# Patient Record
Sex: Female | Born: 1940 | Race: White | Hispanic: No | Smoking: Never smoker
Health system: Southern US, Community
[De-identification: ages and names within clinical notes are randomized; demographics above are authoritative.]

## PROBLEM LIST (undated history)

## (undated) DIAGNOSIS — E785 Hyperlipidemia, unspecified: Secondary | ICD-10-CM

## (undated) DIAGNOSIS — K219 Gastro-esophageal reflux disease without esophagitis: Secondary | ICD-10-CM

## (undated) DIAGNOSIS — I639 Cerebral infarction, unspecified: Secondary | ICD-10-CM

## (undated) DIAGNOSIS — G459 Transient cerebral ischemic attack, unspecified: Secondary | ICD-10-CM

## (undated) DIAGNOSIS — R609 Edema, unspecified: Secondary | ICD-10-CM

## (undated) DIAGNOSIS — W3400XA Accidental discharge from unspecified firearms or gun, initial encounter: Secondary | ICD-10-CM

## (undated) DIAGNOSIS — R441 Visual hallucinations: Secondary | ICD-10-CM

## (undated) DIAGNOSIS — S0193XA Puncture wound without foreign body of unspecified part of head, initial encounter: Secondary | ICD-10-CM

## (undated) HISTORY — PX: OTHER SURGICAL HISTORY: SHX169

## (undated) HISTORY — PX: HAND SURGERY: SHX662

## (undated) HISTORY — PX: APPENDECTOMY: SHX54

## (undated) HISTORY — PX: FOOT SURGERY: SHX648

---

## 1999-02-10 ENCOUNTER — Encounter: Admission: RE | Admit: 1999-02-10 | Discharge: 1999-05-11 | Payer: Self-pay | Admitting: Family Medicine

## 2001-02-24 ENCOUNTER — Encounter: Payer: Self-pay | Admitting: Emergency Medicine

## 2001-02-24 ENCOUNTER — Emergency Department (HOSPITAL_COMMUNITY): Admission: EM | Admit: 2001-02-24 | Discharge: 2001-02-24 | Payer: Self-pay | Admitting: Emergency Medicine

## 2001-09-08 ENCOUNTER — Other Ambulatory Visit: Admission: RE | Admit: 2001-09-08 | Discharge: 2001-09-08 | Payer: Self-pay | Admitting: Family Medicine

## 2001-09-28 ENCOUNTER — Encounter: Payer: Self-pay | Admitting: Family Medicine

## 2001-09-28 ENCOUNTER — Ambulatory Visit (HOSPITAL_COMMUNITY): Admission: RE | Admit: 2001-09-28 | Discharge: 2001-09-28 | Payer: Self-pay | Admitting: Family Medicine

## 2002-02-03 ENCOUNTER — Ambulatory Visit (HOSPITAL_COMMUNITY): Admission: RE | Admit: 2002-02-03 | Discharge: 2002-02-03 | Payer: Self-pay | Admitting: Family Medicine

## 2002-02-03 ENCOUNTER — Encounter: Payer: Self-pay | Admitting: Family Medicine

## 2002-07-06 ENCOUNTER — Emergency Department (HOSPITAL_COMMUNITY): Admission: EM | Admit: 2002-07-06 | Discharge: 2002-07-06 | Payer: Self-pay | Admitting: Emergency Medicine

## 2002-12-20 ENCOUNTER — Encounter: Payer: Self-pay | Admitting: Family Medicine

## 2002-12-20 ENCOUNTER — Ambulatory Visit (HOSPITAL_COMMUNITY): Admission: RE | Admit: 2002-12-20 | Discharge: 2002-12-20 | Payer: Self-pay | Admitting: Family Medicine

## 2003-03-02 ENCOUNTER — Encounter: Payer: Self-pay | Admitting: Family Medicine

## 2003-03-02 ENCOUNTER — Ambulatory Visit (HOSPITAL_COMMUNITY): Admission: RE | Admit: 2003-03-02 | Discharge: 2003-03-02 | Payer: Self-pay | Admitting: Family Medicine

## 2003-03-04 ENCOUNTER — Encounter: Payer: Self-pay | Admitting: Family Medicine

## 2003-03-04 ENCOUNTER — Emergency Department (HOSPITAL_COMMUNITY): Admission: EM | Admit: 2003-03-04 | Discharge: 2003-03-04 | Payer: Self-pay | Admitting: Emergency Medicine

## 2003-03-04 ENCOUNTER — Encounter: Payer: Self-pay | Admitting: Emergency Medicine

## 2003-09-10 ENCOUNTER — Emergency Department (HOSPITAL_COMMUNITY): Admission: EM | Admit: 2003-09-10 | Discharge: 2003-09-10 | Payer: Self-pay | Admitting: Emergency Medicine

## 2004-01-15 ENCOUNTER — Ambulatory Visit (HOSPITAL_COMMUNITY): Admission: RE | Admit: 2004-01-15 | Discharge: 2004-01-15 | Payer: Self-pay | Admitting: Family Medicine

## 2004-06-03 ENCOUNTER — Emergency Department (HOSPITAL_COMMUNITY): Admission: EM | Admit: 2004-06-03 | Discharge: 2004-06-03 | Payer: Self-pay | Admitting: Emergency Medicine

## 2004-09-03 ENCOUNTER — Emergency Department (HOSPITAL_COMMUNITY): Admission: EM | Admit: 2004-09-03 | Discharge: 2004-09-03 | Payer: Self-pay | Admitting: Emergency Medicine

## 2004-10-06 ENCOUNTER — Ambulatory Visit (HOSPITAL_COMMUNITY): Admission: RE | Admit: 2004-10-06 | Discharge: 2004-10-06 | Payer: Self-pay | Admitting: Family Medicine

## 2004-10-21 ENCOUNTER — Ambulatory Visit (HOSPITAL_COMMUNITY): Admission: RE | Admit: 2004-10-21 | Discharge: 2004-10-21 | Payer: Self-pay | Admitting: Family Medicine

## 2004-10-29 ENCOUNTER — Ambulatory Visit: Payer: Self-pay | Admitting: Orthopedic Surgery

## 2004-11-06 ENCOUNTER — Encounter (HOSPITAL_COMMUNITY): Admission: RE | Admit: 2004-11-06 | Discharge: 2004-12-06 | Payer: Self-pay | Admitting: Orthopedic Surgery

## 2004-11-24 ENCOUNTER — Ambulatory Visit: Payer: Self-pay | Admitting: Orthopedic Surgery

## 2004-12-08 ENCOUNTER — Encounter (HOSPITAL_COMMUNITY): Admission: RE | Admit: 2004-12-08 | Discharge: 2005-01-07 | Payer: Self-pay | Admitting: Orthopedic Surgery

## 2005-02-04 ENCOUNTER — Observation Stay (HOSPITAL_COMMUNITY): Admission: EM | Admit: 2005-02-04 | Discharge: 2005-02-10 | Payer: Self-pay | Admitting: Emergency Medicine

## 2005-02-04 ENCOUNTER — Ambulatory Visit: Payer: Self-pay | Admitting: *Deleted

## 2005-08-20 ENCOUNTER — Emergency Department (HOSPITAL_COMMUNITY): Admission: EM | Admit: 2005-08-20 | Discharge: 2005-08-20 | Payer: Self-pay | Admitting: Emergency Medicine

## 2005-09-11 ENCOUNTER — Ambulatory Visit (HOSPITAL_COMMUNITY): Admission: RE | Admit: 2005-09-11 | Discharge: 2005-09-11 | Payer: Self-pay | Admitting: Family Medicine

## 2005-09-25 ENCOUNTER — Ambulatory Visit (HOSPITAL_COMMUNITY): Admission: RE | Admit: 2005-09-25 | Discharge: 2005-09-25 | Payer: Self-pay | Admitting: Family Medicine

## 2006-03-25 ENCOUNTER — Ambulatory Visit (HOSPITAL_COMMUNITY): Admission: RE | Admit: 2006-03-25 | Discharge: 2006-03-25 | Payer: Self-pay | Admitting: Family Medicine

## 2006-06-07 IMAGING — CR DG CHEST 1V
1 series · 1 of 1 positions shown · non-contrast
Comparison: 08/20/2005.

CLINICAL DATA: Productive cough.
 CHEST, ONE VIEW ? 09/11/2005 ? (6663 HOURS):

[view not recorded]
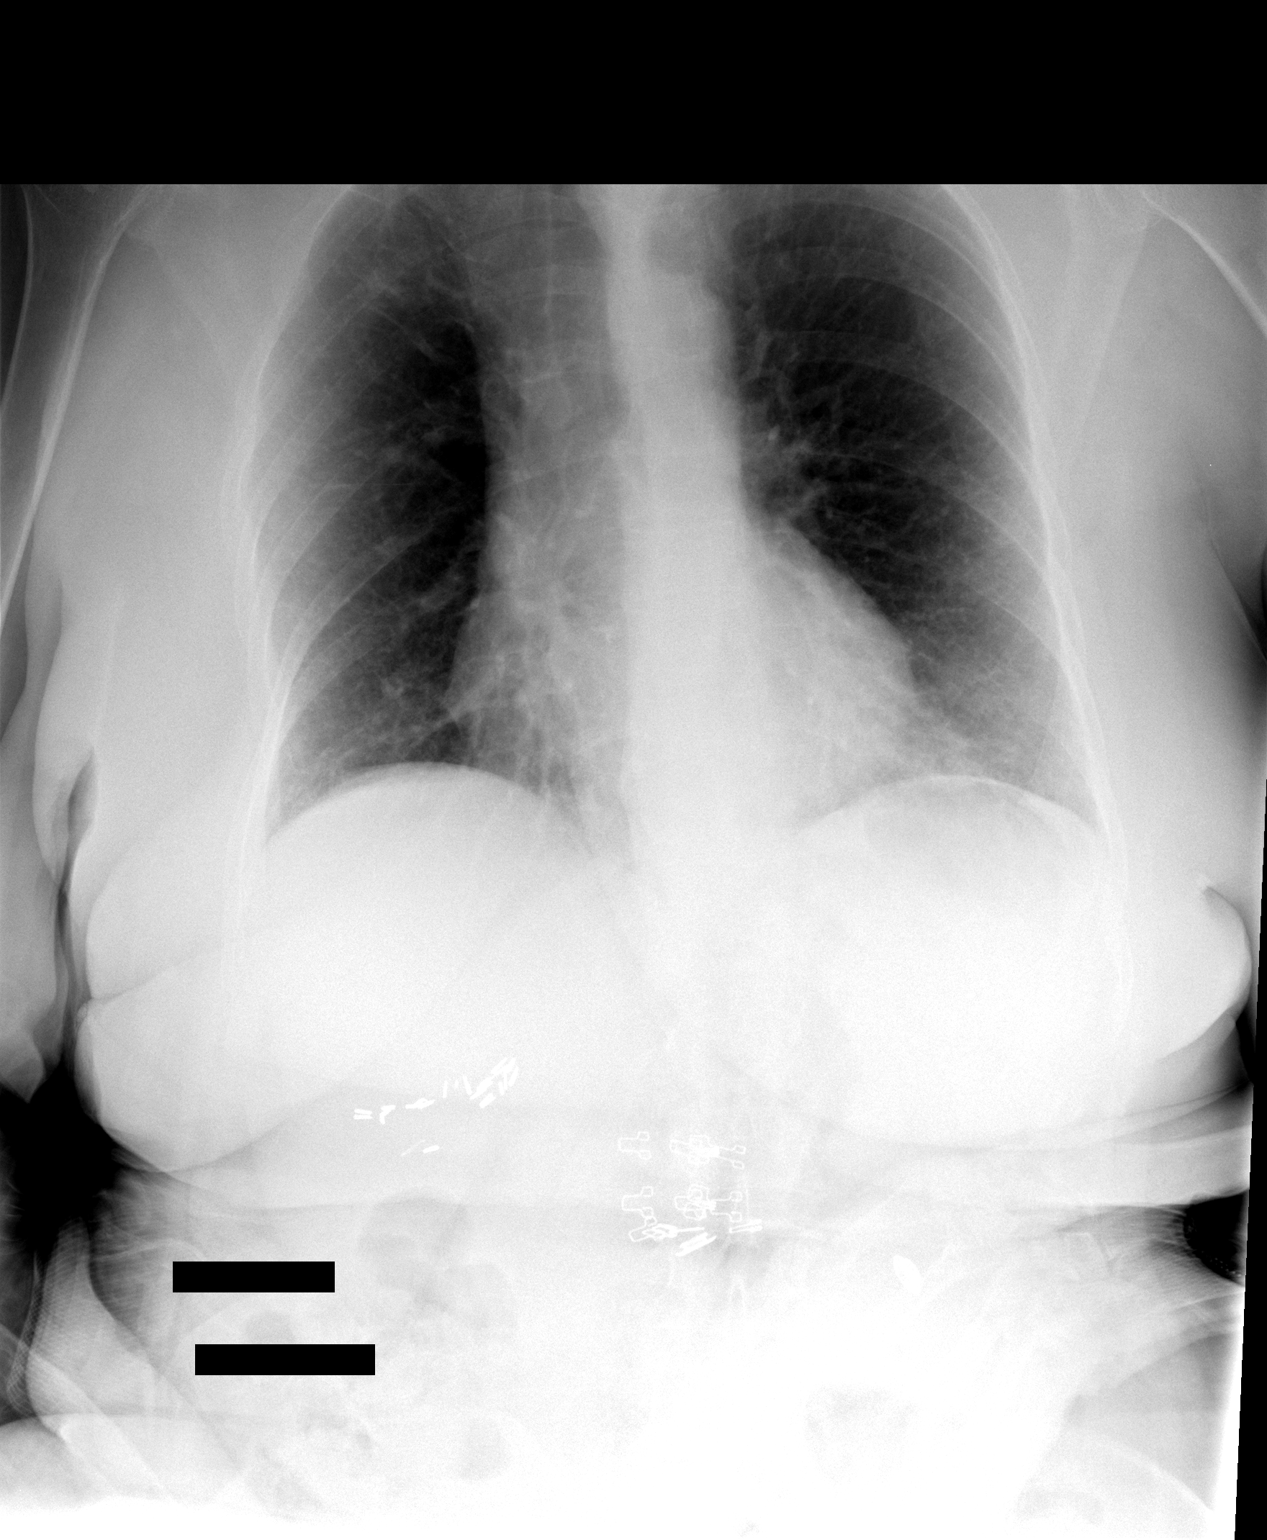

[1 of 1 positions shown; findings below may reference images not displayed]

FINDINGS: The heart is not enlarged.  There is no infiltrate or effusion.  There is COPD.
 There is an ill-defined density in the right upper lobe measuring about 1cm.  This could represent a mass lesion or possibly an early infiltrate.  Follow-up two view chest x-ray is suggested.
IMPRESSION: 1. COPD.
 2. Question right upper lobe density.  Follow-up chest x-ray suggested.

## 2009-03-29 ENCOUNTER — Emergency Department (HOSPITAL_COMMUNITY): Admission: EM | Admit: 2009-03-29 | Discharge: 2009-03-29 | Payer: Self-pay | Admitting: Emergency Medicine

## 2009-09-07 DIAGNOSIS — I639 Cerebral infarction, unspecified: Secondary | ICD-10-CM

## 2009-09-07 HISTORY — DX: Cerebral infarction, unspecified: I63.9

## 2010-12-14 LAB — URINALYSIS, ROUTINE W REFLEX MICROSCOPIC
Bilirubin Urine: NEGATIVE
Glucose, UA: NEGATIVE mg/dL
Ketones, ur: NEGATIVE mg/dL
Leukocytes, UA: NEGATIVE
Protein, ur: NEGATIVE mg/dL

## 2010-12-14 LAB — DIFFERENTIAL
Basophils Absolute: 0 10*3/uL (ref 0.0–0.1)
Eosinophils Absolute: 0.3 10*3/uL (ref 0.0–0.7)
Eosinophils Relative: 4 % (ref 0–5)
Lymphocytes Relative: 28 % (ref 12–46)
Neutrophils Relative %: 53 % (ref 43–77)

## 2010-12-14 LAB — CBC
Hemoglobin: 13.6 g/dL (ref 12.0–15.0)
MCV: 93.5 fL (ref 78.0–100.0)
RBC: 4.09 MIL/uL (ref 3.87–5.11)
WBC: 7.8 10*3/uL (ref 4.0–10.5)

## 2010-12-14 LAB — COMPREHENSIVE METABOLIC PANEL
ALT: 28 U/L (ref 0–35)
AST: 41 U/L — ABNORMAL HIGH (ref 0–37)
CO2: 31 mEq/L (ref 19–32)
Chloride: 96 mEq/L (ref 96–112)
Creatinine, Ser: 1.32 mg/dL — ABNORMAL HIGH (ref 0.4–1.2)
GFR calc Af Amer: 48 mL/min — ABNORMAL LOW (ref >60)
GFR calc non Af Amer: 40 mL/min — ABNORMAL LOW (ref >60)
Glucose, Bld: 144 mg/dL — ABNORMAL HIGH (ref 70–99)
Total Bilirubin: 0.6 mg/dL (ref 0.3–1.2)

## 2010-12-14 LAB — PROTIME-INR: Prothrombin Time: 12.7 seconds (ref 11.6–15.2)
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
## 2011-01-23 NOTE — Group Therapy Note (Signed)
NAME:  Catherine Barrett, Catherine Barrett                   ACCOUNT NO.:  [id number]   MEDICAL RECORD NO.:  [id number]          PATIENT TYPE:  INP   LOCATION:  A210                          FACILITY:  [facility]   PHYSICIAN:  Angus G. Renard Matter, MD   DATE OF BIRTH:  Apr 21, 1941   DATE OF PROCEDURE:  02/09/2005  DATE OF DISCHARGE:                                   PROGRESS NOTE   SUBJECTIVE:  This patient was admitted to the hospital with neurological  symptoms, weakness in right arm and leg, inability to walk, and slurring of  her speech. It was felt that this was unlikely an ischemic event but  worsening of neurological symptoms due to metabolic derangement.   OBJECTIVE:  VITAL SIGNS:  Blood pressure 95/50, respirations 20, pulse 71,  temperature 97.5.  LUNGS:  Clear to P&A.  HEART:  Regular rhythm.  ABDOMEN:  No palpable organs or masses.  EXTREMITIES:  Weakness of right arm and leg. Tenderness over the foot and  paraspinals muscles of low back.   ASSESSMENT:  The patient was admitted with weakness in the right arm and  leg, inability to walk, and slurring of speech.   PLAN:  Continue current regimen. Will attempt to ambulate patient more with  assistance of physical therapy.      AGM/MEDQ  D:  02/09/2005  T:  02/09/2005  Job:  [id number]
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
## 2011-01-23 NOTE — Group Therapy Note (Signed)
NAME:  Catherine Barrett, Catherine Barrett                   ACCOUNT NO.:  [id number]   MEDICAL RECORD NO.:  [id number]          PATIENT TYPE:  INP   LOCATION:  A210                          FACILITY:  [facility]   PHYSICIAN:  Angus G. Renard Matter, MD   DATE OF BIRTH:  [birth year]   DATE OF PROCEDURE:  02/08/2005  DATE OF DISCHARGE:                                   PROGRESS NOTE   SUBJECTIVE:  This patient was admitted to the hospital with neurological  symptoms, weakness in right arm and leg, inability to walk and slurring of  her speech.  It was felt that this was unlikely an ischemic event but  worsening of neurological symptoms due to metabolic derangement.  Patient  has had low back pain and pain in her right foot.   OBJECTIVE:  VITAL SIGNS:  Blood pressure 101/50, respirations 18, pulse 63,  temperature 97.  LUNGS:  Clear to P&A.  HEART:  Regular rhythm.  ABDOMEN:  No palpable organs or masses.  MUSCULOSKELETAL:  Patient has weakness of right side with contracture of  right arm.  Tenderness of the paraspinous muscles of the low back.   ASSESSMENT:  Same as above.   PLAN:  Plan to continue current regimen.  We will continue with gradual  ambulation with the help of physical therapy.      AGM/MEDQ  D:  02/08/2005  T:  02/08/2005  Job:  [id number]
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
## 2011-01-23 NOTE — Group Therapy Note (Signed)
NAME:  Catherine Barrett, Catherine Barrett                   ACCOUNT NO.:  [id number]   MEDICAL RECORD NO.:  [id number]           PATIENT TYPE:  INP   LOCATION:  A210                          FACILITY:  [facility]   PHYSICIAN:  Angus G. Renard Matter, MD   DATE OF BIRTH:  [birth year]   DATE OF PROCEDURE:  02/05/2005  DATE OF DISCHARGE:                                   PROGRESS NOTE   SUBJECTIVE:  This patient was admitted with weakness in the right leg,  inability to walk.   OBJECTIVE:  VITAL SIGNS:  Blood pressure 87/45, respirations 20, pulse 68,  temperature 97.2.  LUNGS:  Clear to P&A.  HEART:  Regular rhythm.  ABDOMEN:  No palpable organs or masses.  The patient has right sided  weakness.   ASSESSMENT:  The patient was admitted with inability to walk  Has possible  CVA, hypokalemia.   PLAN:  Proceed with carotid Doppler ultrasound, echocardiogram.  The patient  has low serum potassium.  Will give three runs of K-Ciel 10 mEq today,  repeat B-met.      AGM/MEDQ  D:  02/05/2005  T:  02/05/2005  Job:  [id number]

## 2011-03-17 ENCOUNTER — Other Ambulatory Visit: Payer: Self-pay

## 2011-03-17 ENCOUNTER — Emergency Department (HOSPITAL_COMMUNITY): Payer: Medicare Other

## 2011-03-17 ENCOUNTER — Observation Stay (HOSPITAL_COMMUNITY)
Admission: EM | Admit: 2011-03-17 | Discharge: 2011-03-18 | Disposition: A | Discharge status: Home / Self Care | Payer: Medicare Other | Attending: Internal Medicine | Admitting: Internal Medicine

## 2011-03-17 DIAGNOSIS — R112 Nausea with vomiting, unspecified: Secondary | ICD-10-CM | POA: Insufficient documentation

## 2011-03-17 DIAGNOSIS — E860 Dehydration: Secondary | ICD-10-CM

## 2011-03-17 DIAGNOSIS — E871 Hypo-osmolality and hyponatremia: Secondary | ICD-10-CM

## 2011-03-17 DIAGNOSIS — R42 Dizziness and giddiness: Secondary | ICD-10-CM | POA: Insufficient documentation

## 2011-03-17 DIAGNOSIS — W19XXXA Unspecified fall, initial encounter: Secondary | ICD-10-CM | POA: Diagnosis present

## 2011-03-17 DIAGNOSIS — M545 Low back pain, unspecified: Secondary | ICD-10-CM | POA: Insufficient documentation

## 2011-03-17 DIAGNOSIS — W050XXA Fall from non-moving wheelchair, initial encounter: Secondary | ICD-10-CM | POA: Insufficient documentation

## 2011-03-17 DIAGNOSIS — S301XXA Contusion of abdominal wall, initial encounter: Principal | ICD-10-CM | POA: Insufficient documentation

## 2011-03-17 DIAGNOSIS — N281 Cyst of kidney, acquired: Secondary | ICD-10-CM | POA: Insufficient documentation

## 2011-03-17 DIAGNOSIS — S0190XA Unspecified open wound of unspecified part of head, initial encounter: Secondary | ICD-10-CM | POA: Insufficient documentation

## 2011-03-17 DIAGNOSIS — M25559 Pain in unspecified hip: Secondary | ICD-10-CM | POA: Insufficient documentation

## 2011-03-17 DIAGNOSIS — M542 Cervicalgia: Secondary | ICD-10-CM | POA: Insufficient documentation

## 2011-03-17 DIAGNOSIS — R52 Pain, unspecified: Secondary | ICD-10-CM | POA: Insufficient documentation

## 2011-03-17 DIAGNOSIS — Z79899 Other long term (current) drug therapy: Secondary | ICD-10-CM | POA: Insufficient documentation

## 2011-03-17 HISTORY — DX: Visual hallucinations: R44.1

## 2011-03-17 HISTORY — DX: Puncture wound without foreign body of unspecified part of head, initial encounter: S01.93XA

## 2011-03-17 HISTORY — DX: Transient cerebral ischemic attack, unspecified: G45.9

## 2011-03-17 HISTORY — DX: Accidental discharge from unspecified firearms or gun, initial encounter: W34.00XA

## 2011-03-17 LAB — POCT I-STAT, CHEM 8
BUN: 20 mg/dL (ref 6–23)
Chloride: 90 mEq/L — ABNORMAL LOW (ref 96–112)
Creatinine, Ser: 1.2 mg/dL — ABNORMAL HIGH (ref 0.50–1.10)
Potassium: 3.2 mEq/L — ABNORMAL LOW (ref 3.5–5.1)
Sodium: 128 mEq/L — ABNORMAL LOW (ref 135–145)

## 2011-03-17 LAB — GLUCOSE, CAPILLARY: Glucose-Capillary: 106 mg/dL — ABNORMAL HIGH (ref 70–99)

## 2011-03-17 MED ORDER — SODIUM CHLORIDE 0.9 % IV SOLN
Freq: Once | INTRAVENOUS | Status: AC
  Administered 2011-03-17: 16:00:00 via INTRAVENOUS

## 2011-03-17 MED ORDER — PHENOBARBITAL 32.4 MG PO TABS
32.4000 mg | ORAL_TABLET | Freq: Once | ORAL | Status: AC
  Administered 2011-03-18: 30 mg via ORAL
  Filled 2011-03-17: qty 1

## 2011-03-17 MED ORDER — ALUM & MAG HYDROXIDE-SIMETH 200-200-20 MG/5ML PO SUSP: 30.0000 mL | Freq: Four times a day (QID) | ORAL | Status: DC | PRN

## 2011-03-17 MED ORDER — SENNA 8.6 MG PO TABS: 2.0000 | ORAL_TABLET | Freq: Every day | ORAL | Status: DC | PRN

## 2011-03-17 MED ORDER — ROSUVASTATIN CALCIUM 20 MG PO TABS: 20.0000 mg | ORAL_TABLET | Freq: Every day | ORAL | Status: DC

## 2011-03-17 MED ORDER — ONDANSETRON HCL 4 MG/2ML IJ SOLN: 4.0000 mg | Freq: Four times a day (QID) | INTRAMUSCULAR | Status: DC | PRN

## 2011-03-17 MED ORDER — CALCIUM CARBONATE-VITAMIN D 500-200 MG-UNIT PO TABS
0.5000 | ORAL_TABLET | Freq: Every day | ORAL | Status: DC
  Administered 2011-03-18: via ORAL
  Filled 2011-03-17: qty 1

## 2011-03-17 MED ORDER — ACETAMINOPHEN 325 MG PO TABS
650.0000 mg | ORAL_TABLET | Freq: Four times a day (QID) | ORAL | Status: DC | PRN
  Administered 2011-03-18: 650 mg via ORAL
  Filled 2011-03-17: qty 2

## 2011-03-17 MED ORDER — CLOPIDOGREL BISULFATE 75 MG PO TABS
75.0000 mg | ORAL_TABLET | Freq: Every day | ORAL | Status: DC
  Administered 2011-03-18: 75 mg via ORAL
  Filled 2011-03-17: qty 1

## 2011-03-17 MED ORDER — PANTOPRAZOLE SODIUM 20 MG PO TBEC
20.0000 mg | DELAYED_RELEASE_TABLET | Freq: Every day | ORAL | Status: DC
  Filled 2011-03-17 (×2): qty 1

## 2011-03-17 MED ORDER — HALOPERIDOL 2 MG PO TABS
1.0000 mg | ORAL_TABLET | Freq: Two times a day (BID) | ORAL | Status: DC
  Administered 2011-03-18: 1 mg via ORAL
  Administered 2011-03-18: via ORAL
  Filled 2011-03-17 (×2): qty 1

## 2011-03-17 MED ORDER — SODIUM CHLORIDE 0.9 % IV BOLUS (SEPSIS)
500.0000 mL | Freq: Once | INTRAVENOUS | Status: AC
  Administered 2011-03-17: 500 mL via INTRAVENOUS
  Filled 2011-03-17: qty 500

## 2011-03-17 MED ORDER — SODIUM CHLORIDE 0.9 % IV SOLN
Freq: Once | INTRAVENOUS | Status: AC
  Administered 2011-03-17: 23:00:00 via INTRAVENOUS

## 2011-03-17 MED ORDER — ACETAMINOPHEN 500 MG PO TABS
1000.0000 mg | ORAL_TABLET | Freq: Once | ORAL | Status: AC
  Administered 2011-03-17: 1000 mg via ORAL
  Filled 2011-03-17: qty 2

## 2011-03-17 MED ORDER — SODIUM CHLORIDE 0.9 % IV SOLN
INTRAVENOUS | Status: DC
  Administered 2011-03-18: via INTRAVENOUS

## 2011-03-17 MED ORDER — FOLIC ACID 1 MG PO TABS
1.0000 mg | ORAL_TABLET | Freq: Every day | ORAL | Status: DC
  Administered 2011-03-18: 1 mg via ORAL
  Filled 2011-03-17: qty 1

## 2011-03-17 MED ORDER — ONDANSETRON HCL 4 MG PO TABS: 4.0000 mg | ORAL_TABLET | Freq: Four times a day (QID) | ORAL | Status: DC | PRN

## 2011-03-17 MED ORDER — ACETAMINOPHEN 80 MG RE SUPP
650.0000 mg | Freq: Four times a day (QID) | RECTAL | Status: DC | PRN
  Filled 2011-03-17: qty 2

## 2011-03-17 MED ORDER — PHENYTOIN SODIUM EXTENDED 100 MG PO CAPS
100.0000 mg | ORAL_CAPSULE | Freq: Three times a day (TID) | ORAL | Status: DC
  Administered 2011-03-18 (×2): 100 mg via ORAL
  Filled 2011-03-17 (×2): qty 1

## 2011-03-17 MED ORDER — CALCIUM CITRATE-VITAMIN D 250-100 MG-UNIT PO TABS: 1.0000 | ORAL_TABLET | ORAL | Status: DC

## 2011-03-17 MED ORDER — CVS SPECTRAVITE ADVANCED PO TABS: 1.0000 | ORAL_TABLET | ORAL | Status: DC

## 2011-03-17 MED ORDER — ENOXAPARIN SODIUM 40 MG/0.4ML ~~LOC~~ SOLN
40.0000 mg | Freq: Every day | SUBCUTANEOUS | Status: DC
  Administered 2011-03-18: 40 mg via SUBCUTANEOUS
  Filled 2011-03-17 (×2): qty 0.4

## 2011-03-17 MED ORDER — ZOLPIDEM TARTRATE 5 MG PO TABS
5.0000 mg | ORAL_TABLET | Freq: Every evening | ORAL | Status: DC | PRN
  Administered 2011-03-18: 5 mg via ORAL
  Filled 2011-03-17: qty 1
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
## 2011-03-17 NOTE — ED Notes (Signed)
Report called to RN on unit.  Pt admitted to room 313.  Condition stable, no complaints at this time.  JJohnsonRN
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
## 2011-03-17 NOTE — ED Notes (Signed)
Called for admission to dr Karilyn Cota Pt stable at this time  Joya Gaskins, MD 03/17/11 1753

## 2011-03-17 NOTE — ED Notes (Signed)
Per ems, pt was found at home in her wheelchair. Pt had fallen from a standing position onto the arm of her wheelchair.  Pt had been there an hour before help arrived.  Pt in C collar and on backboard.

## 2011-03-17 NOTE — ED Notes (Signed)
EKG: normal EKG, normal sinus rhythm.   Joya Gaskins, MD 03/17/11 [phone]

## 2011-03-17 NOTE — ED Notes (Signed)
Pt denies complaint at this time.  Resting comfortably.  No IV access at this time, pt has been attempted by multiple RNs.  Will reattempt.

## 2011-03-18 ENCOUNTER — Other Ambulatory Visit: Payer: Self-pay

## 2011-03-18 LAB — COMPREHENSIVE METABOLIC PANEL
ALT: 26 U/L (ref 0–35)
AST: 36 U/L (ref 0–37)
Albumin: 3.7 g/dL (ref 3.5–5.2)
Alkaline Phosphatase: 108 U/L (ref 39–117)
CO2: 30 mEq/L (ref 19–32)
Chloride: 94 mEq/L — ABNORMAL LOW (ref 96–112)
GFR calc non Af Amer: 55 mL/min — ABNORMAL LOW (ref >60)
Potassium: 3.2 mEq/L — ABNORMAL LOW (ref 3.5–5.1)
Sodium: 134 mEq/L — ABNORMAL LOW (ref 135–145)
Total Bilirubin: 0.3 mg/dL (ref 0.3–1.2)

## 2011-03-18 LAB — CREATININE, SERUM
GFR calc Af Amer: >60 mL/min (ref >60)
GFR calc non Af Amer: 51 mL/min — ABNORMAL LOW (ref >60)

## 2011-03-18 LAB — CBC
HCT: 34.8 % — ABNORMAL LOW (ref 36.0–46.0)
MCV: 89 fL (ref 78.0–100.0)
RBC: 3.91 MIL/uL (ref 3.87–5.11)
RDW: 12.4 % (ref 11.5–15.5)
WBC: 11.2 10*3/uL — ABNORMAL HIGH (ref 4.0–10.5)

## 2011-03-18 LAB — CARDIAC PANEL(CRET KIN+CKTOT+MB+TROPI): CK, MB: 3.8 ng/mL (ref 0.3–4.0)

## 2011-03-18 LAB — GLUCOSE, CAPILLARY: Glucose-Capillary: 110 mg/dL — ABNORMAL HIGH (ref 70–99)

## 2011-03-18 MED ORDER — THERA M PLUS PO TABS
1.0000 | ORAL_TABLET | Freq: Every day | ORAL | Status: DC
  Administered 2011-03-18: 1 via ORAL
  Filled 2011-03-18: qty 1

## 2011-03-18 MED ORDER — POTASSIUM CHLORIDE CRYS ER 20 MEQ PO TBCR
40.0000 meq | EXTENDED_RELEASE_TABLET | Freq: Once | ORAL | Status: AC
  Administered 2011-03-18: 40 meq via ORAL
  Filled 2011-03-18: qty 2

## 2011-03-18 MED ORDER — PANTOPRAZOLE SODIUM 40 MG PO TBEC: 40.0000 mg | DELAYED_RELEASE_TABLET | Freq: Every day | ORAL | Status: DC

## 2011-03-18 NOTE — Discharge Summary (Signed)
Physician Discharge Summary  Patient ID: Catherine Barrett MRN: [id number] DOB/AGE: 1941/05/28 70 y.o.  Admit date: 03/17/2011 Discharge date: 03/18/2011  Admission Diagnoses: Fall.  Discharge Diagnoses:  Principal Problem:  *Fall Active Problems:  Hyponatremia  Dehydration, mild   Discharged Condition: Stable and improved.  Hospital Course: This very pleasant lady was admitted with an accidental fall. She had generalized pain. Please see initial history and physical examination done by Dr. Karilyn Cota. There was no loss of consciousness and no symptoms of cardiac disease. She never lost consciousness. Serial cardiac enzymes were negative. Today she feels much improved and her pain is lessened. She is keen to go home. She has been given intravenous normal saline overnight and her sodium has improved. Her CT scan of her head and abdomen on admission all negative/normal for any injury.  Consults: None.  Significant Diagnostic Studies: Her sodium is 132 and now compared to 128 yesterday.  Treatments: IV hydration  Discharge Exam: Blood pressure 152/69, pulse 66, temperature 98 F (36.7 C), temperature source Oral, resp. rate 18, height 5\' 4"  (1.626 m), weight 65.4 kg (144 lb 2.9 oz), SpO2 98.00%. She looks systemically well. Heart sounds are present and normal lung fields are clear. She is alert and oriented and there are no new focal neurological signs. There is no evidence of bony injury.  Disposition:   Discharge Orders    Future Orders Please Complete By Expires   Diet - low sodium heart healthy      Increase activity slowly        Current Discharge Medication List    CONTINUE these medications which have NOT CHANGED   Details  atorvastatin (LIPITOR) 40 MG tablet Take 40 mg by mouth daily.      calcium-vitamin D 250-100 MG-UNIT per tablet Take 1 tablet by mouth 1 day or 1 dose.      clopidogrel (PLAVIX) 75 MG tablet Take 75 mg by mouth daily.      folic acid (FOLVITE) 1 MG  tablet Take 1 mg by mouth daily.      haloperidol (HALDOL) 1 MG tablet Take 1 mg by mouth 2 (two) times daily.      lansoprazole (PREVACID) 30 MG capsule Take 30 mg by mouth 2 (two) times daily.      Multiple Vitamins-Minerals ([employer] SPECTRAVITE ADVANCED PO) Take 1 tablet by mouth 1 day or 1 dose.      PHENobarbital (LUMINAL) 32.4 MG tablet Take 32.4 mg by mouth 1 day or 1 dose.      phenytoin (DILANTIN) 100 MG ER capsule Take by mouth 3 (three) times daily.      potassium chloride SA (K-DUR,KLOR-CON) 20 MEQ tablet Take 20 mEq by mouth daily.      triamterene-hydrochlorothiazide (DYAZIDE) 37.5-25 MG per capsule Take 1 capsule by mouth daily.        I do not think the patient needs any specific follow up with her primary care physician for this admission. Her potassium is still 3.2 and I have told her to take extra potassium today. She can have her electrolytes checked the next time she visits her primary care physician.   SignedWilson Singer 03/18/2011, 10:24 AM

## 2011-03-18 NOTE — Progress Notes (Signed)
Pt discharged home at 1230 with sister.  Follow up appts and discharge instructions given.  Pt and family acknowledged orders and no issues stated.

## 2011-03-20 LAB — PHENYTOIN LEVEL, FREE AND TOTAL
Phenytoin Bound: 8.1 mg/L
Phenytoin, Free: 1.6 mg/L (ref 1.0–2.0)
Phenytoin, Total: 9.7 mg/L — ABNORMAL LOW (ref 10.0–20.0)

## 2011-04-29 NOTE — Progress Notes (Signed)
Encounter addended by: Karen Kays on: 04/29/2011  4:57 PM<BR>     Documentation filed: Flowsheet VN

## 2015-11-08 ENCOUNTER — Encounter (HOSPITAL_COMMUNITY): Payer: Self-pay | Admitting: Emergency Medicine

## 2015-11-08 ENCOUNTER — Emergency Department (HOSPITAL_COMMUNITY): Payer: Medicare Other

## 2015-11-08 ENCOUNTER — Emergency Department (HOSPITAL_COMMUNITY)
Admission: EM | Admit: 2015-11-08 | Discharge: 2015-11-08 | Disposition: A | Discharge status: Home / Self Care | Payer: Medicare Other | Attending: Emergency Medicine | Admitting: Emergency Medicine

## 2015-11-08 DIAGNOSIS — Z87828 Personal history of other (healed) physical injury and trauma: Secondary | ICD-10-CM | POA: Insufficient documentation

## 2015-11-08 DIAGNOSIS — Z8673 Personal history of transient ischemic attack (TIA), and cerebral infarction without residual deficits: Secondary | ICD-10-CM | POA: Insufficient documentation

## 2015-11-08 DIAGNOSIS — Q02 Microcephaly: Secondary | ICD-10-CM | POA: Diagnosis not present

## 2015-11-08 DIAGNOSIS — Z79899 Other long term (current) drug therapy: Secondary | ICD-10-CM | POA: Diagnosis not present

## 2015-11-08 DIAGNOSIS — F419 Anxiety disorder, unspecified: Secondary | ICD-10-CM | POA: Insufficient documentation

## 2015-11-08 DIAGNOSIS — R410 Disorientation, unspecified: Secondary | ICD-10-CM | POA: Diagnosis not present

## 2015-11-08 DIAGNOSIS — Z7951 Long term (current) use of inhaled steroids: Secondary | ICD-10-CM | POA: Diagnosis not present

## 2015-11-08 DIAGNOSIS — R441 Visual hallucinations: Secondary | ICD-10-CM | POA: Diagnosis not present

## 2015-11-08 DIAGNOSIS — Z7902 Long term (current) use of antithrombotics/antiplatelets: Secondary | ICD-10-CM | POA: Diagnosis not present

## 2015-11-08 DIAGNOSIS — R4182 Altered mental status, unspecified: Secondary | ICD-10-CM | POA: Diagnosis present

## 2015-11-08 LAB — COMPREHENSIVE METABOLIC PANEL
ALBUMIN: 4.4 g/dL (ref 3.5–5.0)
ALK PHOS: 128 U/L — AB (ref 38–126)
ALT: 29 U/L (ref 14–54)
ANION GAP: 9 (ref 5–15)
AST: 40 U/L (ref 15–41)
BILIRUBIN TOTAL: 0.4 mg/dL (ref 0.3–1.2)
BUN: 23 mg/dL — ABNORMAL HIGH (ref 6–20)
CALCIUM: 9.2 mg/dL (ref 8.9–10.3)
CO2: 30 mmol/L (ref 22–32)
Chloride: 98 mmol/L — ABNORMAL LOW (ref 101–111)
Creatinine, Ser: 1.38 mg/dL — ABNORMAL HIGH (ref 0.44–1.00)
GFR calc non Af Amer: 36 mL/min — ABNORMAL LOW (ref >60)
GFR, EST AFRICAN AMERICAN: 42 mL/min — AB (ref >60)
GLUCOSE: 122 mg/dL — AB (ref 65–99)
POTASSIUM: 3.3 mmol/L — AB (ref 3.5–5.1)
SODIUM: 137 mmol/L (ref 135–145)
TOTAL PROTEIN: 8.2 g/dL — AB (ref 6.5–8.1)

## 2015-11-08 LAB — CBC WITH DIFFERENTIAL/PLATELET
BASOS ABS: 0.1 10*3/uL (ref 0.0–0.1)
BASOS PCT: 0 %
EOS ABS: 0.4 10*3/uL (ref 0.0–0.7)
Eosinophils Relative: 4 %
HEMATOCRIT: 41 % (ref 36.0–46.0)
HEMOGLOBIN: 14.1 g/dL (ref 12.0–15.0)
Lymphocytes Relative: 30 %
Lymphs Abs: 3.4 10*3/uL (ref 0.7–4.0)
MCH: 32.3 pg (ref 26.0–34.0)
MCHC: 34.4 g/dL (ref 30.0–36.0)
MCV: 94 fL (ref 78.0–100.0)
Monocytes Absolute: 1.3 10*3/uL — ABNORMAL HIGH (ref 0.1–1.0)
Monocytes Relative: 12 %
NEUTROS ABS: 6.2 10*3/uL (ref 1.7–7.7)
NEUTROS PCT: 54 %
Platelets: 257 10*3/uL (ref 150–400)
RBC: 4.36 MIL/uL (ref 3.87–5.11)
RDW: 13.1 % (ref 11.5–15.5)
WBC: 11.4 10*3/uL — AB (ref 4.0–10.5)

## 2015-11-08 LAB — LIPASE, BLOOD: Lipase: 74 U/L — ABNORMAL HIGH (ref 11–51)

## 2015-11-08 LAB — URINALYSIS, ROUTINE W REFLEX MICROSCOPIC
Bilirubin Urine: NEGATIVE
Glucose, UA: NEGATIVE mg/dL
Hgb urine dipstick: NEGATIVE
Ketones, ur: NEGATIVE mg/dL
LEUKOCYTES UA: NEGATIVE
Nitrite: NEGATIVE
PROTEIN: NEGATIVE mg/dL
SPECIFIC GRAVITY, URINE: 1.01 (ref 1.005–1.030)
pH: 5.5 (ref 5.0–8.0)

## 2015-11-08 LAB — TROPONIN I

## 2015-11-08 LAB — PROTIME-INR
INR: 0.99 (ref 0.00–1.49)
PROTHROMBIN TIME: 13.3 s (ref 11.6–15.2)

## 2015-11-08 LAB — PHENYTOIN LEVEL, TOTAL: PHENYTOIN LVL: 14 ug/mL (ref 10.0–20.0)

## 2015-11-08 MED ORDER — SODIUM CHLORIDE 0.9 % IV BOLUS (SEPSIS)
1000.0000 mL | Freq: Once | INTRAVENOUS | Status: AC
  Administered 2015-11-08: 1000 mL via INTRAVENOUS

## 2015-11-08 NOTE — ED Notes (Signed)
Pt and family updated on plan of care,  

## 2015-11-08 NOTE — ED Notes (Signed)
Pt unable to give urine specimen at this time 

## 2015-11-08 NOTE — ED Notes (Signed)
Pt has bruising noted to her left lower leg. Pt states this comes from the medication.

## 2015-11-08 NOTE — ED Notes (Signed)
Pt from home. Family reports an increase in confusion that has gotten worse since Saturday. Family thinks patient is not taking medications correctly. Pt c/o CP and believes it is due to her medication. Pt has paralysis on RT side of body.

## 2015-11-08 NOTE — ED Notes (Signed)
Dr Lockwood at bedside,  

## 2015-11-08 NOTE — ED Notes (Signed)
MD at bedside. 
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
## 2015-11-08 NOTE — Discharge Instructions (Signed)
As discussed, your evaluation today has been largely reassuring.  But, it is important that you monitor your condition carefully, and do not hesitate to return to the ED if you develop new, or concerning changes in your condition. ? ?Otherwise, please follow-up with your physician for appropriate ongoing care. ? ?

## 2016-04-27 DIAGNOSIS — K219 Gastro-esophageal reflux disease without esophagitis: Secondary | ICD-10-CM | POA: Diagnosis not present

## 2016-04-27 DIAGNOSIS — G479 Sleep disorder, unspecified: Secondary | ICD-10-CM | POA: Diagnosis not present

## 2016-04-27 DIAGNOSIS — R270 Ataxia, unspecified: Secondary | ICD-10-CM | POA: Diagnosis not present

## 2016-04-27 DIAGNOSIS — R413 Other amnesia: Secondary | ICD-10-CM | POA: Diagnosis not present

## 2016-04-27 DIAGNOSIS — I10 Essential (primary) hypertension: Secondary | ICD-10-CM | POA: Diagnosis not present

## 2016-04-27 DIAGNOSIS — G40009 Localization-related (focal) (partial) idiopathic epilepsy and epileptic syndromes with seizures of localized onset, not intractable, without status epilepticus: Secondary | ICD-10-CM | POA: Diagnosis not present

## 2016-04-27 DIAGNOSIS — R443 Hallucinations, unspecified: Secondary | ICD-10-CM | POA: Diagnosis not present

## 2016-04-27 DIAGNOSIS — R600 Localized edema: Secondary | ICD-10-CM | POA: Diagnosis not present

## 2016-04-27 DIAGNOSIS — F0390 Unspecified dementia without behavioral disturbance: Secondary | ICD-10-CM | POA: Diagnosis not present

## 2016-04-30 DIAGNOSIS — E782 Mixed hyperlipidemia: Secondary | ICD-10-CM | POA: Diagnosis not present

## 2016-04-30 DIAGNOSIS — D509 Iron deficiency anemia, unspecified: Secondary | ICD-10-CM | POA: Diagnosis not present

## 2016-04-30 DIAGNOSIS — R5383 Other fatigue: Secondary | ICD-10-CM | POA: Diagnosis not present

## 2016-04-30 DIAGNOSIS — E538 Deficiency of other specified B group vitamins: Secondary | ICD-10-CM | POA: Diagnosis not present

## 2016-04-30 DIAGNOSIS — Z79899 Other long term (current) drug therapy: Secondary | ICD-10-CM | POA: Diagnosis not present

## 2016-04-30 DIAGNOSIS — E559 Vitamin D deficiency, unspecified: Secondary | ICD-10-CM | POA: Diagnosis not present

## 2016-04-30 DIAGNOSIS — R7301 Impaired fasting glucose: Secondary | ICD-10-CM | POA: Diagnosis not present

## 2016-05-05 DIAGNOSIS — F411 Generalized anxiety disorder: Secondary | ICD-10-CM | POA: Diagnosis not present

## 2016-05-05 DIAGNOSIS — N183 Chronic kidney disease, stage 3 (moderate): Secondary | ICD-10-CM | POA: Diagnosis not present

## 2016-05-05 DIAGNOSIS — Z0000 Encounter for general adult medical examination without abnormal findings: Secondary | ICD-10-CM | POA: Diagnosis not present

## 2016-05-05 DIAGNOSIS — E782 Mixed hyperlipidemia: Secondary | ICD-10-CM | POA: Diagnosis not present

## 2016-05-05 DIAGNOSIS — Z87820 Personal history of traumatic brain injury: Secondary | ICD-10-CM | POA: Diagnosis not present

## 2016-05-05 DIAGNOSIS — K219 Gastro-esophageal reflux disease without esophagitis: Secondary | ICD-10-CM | POA: Diagnosis not present

## 2016-05-05 DIAGNOSIS — I10 Essential (primary) hypertension: Secondary | ICD-10-CM | POA: Diagnosis not present

## 2016-05-12 DIAGNOSIS — I739 Peripheral vascular disease, unspecified: Secondary | ICD-10-CM | POA: Diagnosis not present

## 2016-05-27 DIAGNOSIS — G479 Sleep disorder, unspecified: Secondary | ICD-10-CM | POA: Diagnosis not present

## 2016-05-27 DIAGNOSIS — K219 Gastro-esophageal reflux disease without esophagitis: Secondary | ICD-10-CM | POA: Diagnosis not present

## 2016-05-27 DIAGNOSIS — R413 Other amnesia: Secondary | ICD-10-CM | POA: Diagnosis not present

## 2016-05-27 DIAGNOSIS — R270 Ataxia, unspecified: Secondary | ICD-10-CM | POA: Diagnosis not present

## 2016-05-27 DIAGNOSIS — R600 Localized edema: Secondary | ICD-10-CM | POA: Diagnosis not present

## 2016-05-27 DIAGNOSIS — G40009 Localization-related (focal) (partial) idiopathic epilepsy and epileptic syndromes with seizures of localized onset, not intractable, without status epilepticus: Secondary | ICD-10-CM | POA: Diagnosis not present

## 2016-05-27 DIAGNOSIS — I10 Essential (primary) hypertension: Secondary | ICD-10-CM | POA: Diagnosis not present

## 2016-05-27 DIAGNOSIS — R443 Hallucinations, unspecified: Secondary | ICD-10-CM | POA: Diagnosis not present

## 2016-06-24 DIAGNOSIS — R413 Other amnesia: Secondary | ICD-10-CM | POA: Diagnosis not present

## 2016-06-24 DIAGNOSIS — R443 Hallucinations, unspecified: Secondary | ICD-10-CM | POA: Diagnosis not present

## 2016-06-24 DIAGNOSIS — R600 Localized edema: Secondary | ICD-10-CM | POA: Diagnosis not present

## 2016-06-24 DIAGNOSIS — G479 Sleep disorder, unspecified: Secondary | ICD-10-CM | POA: Diagnosis not present

## 2016-06-24 DIAGNOSIS — R270 Ataxia, unspecified: Secondary | ICD-10-CM | POA: Diagnosis not present

## 2016-06-24 DIAGNOSIS — G40009 Localization-related (focal) (partial) idiopathic epilepsy and epileptic syndromes with seizures of localized onset, not intractable, without status epilepticus: Secondary | ICD-10-CM | POA: Diagnosis not present

## 2016-06-24 DIAGNOSIS — K219 Gastro-esophageal reflux disease without esophagitis: Secondary | ICD-10-CM | POA: Diagnosis not present

## 2016-06-24 DIAGNOSIS — Z23 Encounter for immunization: Secondary | ICD-10-CM | POA: Diagnosis not present

## 2016-06-24 DIAGNOSIS — I10 Essential (primary) hypertension: Secondary | ICD-10-CM | POA: Diagnosis not present

## 2016-07-28 DIAGNOSIS — I739 Peripheral vascular disease, unspecified: Secondary | ICD-10-CM | POA: Diagnosis not present

## 2016-08-18 DIAGNOSIS — I739 Peripheral vascular disease, unspecified: Secondary | ICD-10-CM | POA: Diagnosis not present

## 2016-08-18 DIAGNOSIS — L89893 Pressure ulcer of other site, stage 3: Secondary | ICD-10-CM | POA: Diagnosis not present

## 2016-08-19 DIAGNOSIS — I10 Essential (primary) hypertension: Secondary | ICD-10-CM | POA: Diagnosis not present

## 2016-08-19 DIAGNOSIS — G40009 Localization-related (focal) (partial) idiopathic epilepsy and epileptic syndromes with seizures of localized onset, not intractable, without status epilepticus: Secondary | ICD-10-CM | POA: Diagnosis not present

## 2016-08-19 DIAGNOSIS — R413 Other amnesia: Secondary | ICD-10-CM | POA: Diagnosis not present

## 2016-08-19 DIAGNOSIS — R600 Localized edema: Secondary | ICD-10-CM | POA: Diagnosis not present

## 2016-08-19 DIAGNOSIS — K219 Gastro-esophageal reflux disease without esophagitis: Secondary | ICD-10-CM | POA: Diagnosis not present

## 2016-08-19 DIAGNOSIS — R443 Hallucinations, unspecified: Secondary | ICD-10-CM | POA: Diagnosis not present

## 2016-08-19 DIAGNOSIS — G479 Sleep disorder, unspecified: Secondary | ICD-10-CM | POA: Diagnosis not present

## 2016-08-19 DIAGNOSIS — R270 Ataxia, unspecified: Secondary | ICD-10-CM | POA: Diagnosis not present

## 2016-10-13 DIAGNOSIS — I739 Peripheral vascular disease, unspecified: Secondary | ICD-10-CM | POA: Diagnosis not present

## 2016-11-02 DIAGNOSIS — I10 Essential (primary) hypertension: Secondary | ICD-10-CM | POA: Diagnosis not present

## 2016-11-02 DIAGNOSIS — R7301 Impaired fasting glucose: Secondary | ICD-10-CM | POA: Diagnosis not present

## 2016-11-04 DIAGNOSIS — K219 Gastro-esophageal reflux disease without esophagitis: Secondary | ICD-10-CM | POA: Diagnosis not present

## 2016-11-04 DIAGNOSIS — E782 Mixed hyperlipidemia: Secondary | ICD-10-CM | POA: Diagnosis not present

## 2016-11-04 DIAGNOSIS — Z23 Encounter for immunization: Secondary | ICD-10-CM | POA: Diagnosis not present

## 2016-11-04 DIAGNOSIS — N182 Chronic kidney disease, stage 2 (mild): Secondary | ICD-10-CM | POA: Diagnosis not present

## 2016-11-04 DIAGNOSIS — I10 Essential (primary) hypertension: Secondary | ICD-10-CM | POA: Diagnosis not present

## 2016-12-29 DIAGNOSIS — I739 Peripheral vascular disease, unspecified: Secondary | ICD-10-CM | POA: Diagnosis not present

## 2017-02-17 DIAGNOSIS — G40009 Localization-related (focal) (partial) idiopathic epilepsy and epileptic syndromes with seizures of localized onset, not intractable, without status epilepticus: Secondary | ICD-10-CM | POA: Diagnosis not present

## 2017-02-17 DIAGNOSIS — G479 Sleep disorder, unspecified: Secondary | ICD-10-CM | POA: Diagnosis not present

## 2017-02-17 DIAGNOSIS — R413 Other amnesia: Secondary | ICD-10-CM | POA: Diagnosis not present

## 2017-02-17 DIAGNOSIS — R443 Hallucinations, unspecified: Secondary | ICD-10-CM | POA: Diagnosis not present

## 2017-03-30 DIAGNOSIS — I739 Peripheral vascular disease, unspecified: Secondary | ICD-10-CM | POA: Diagnosis not present

## 2017-05-20 DIAGNOSIS — I10 Essential (primary) hypertension: Secondary | ICD-10-CM | POA: Diagnosis not present

## 2017-05-20 DIAGNOSIS — R7301 Impaired fasting glucose: Secondary | ICD-10-CM | POA: Diagnosis not present

## 2017-05-20 DIAGNOSIS — D509 Iron deficiency anemia, unspecified: Secondary | ICD-10-CM | POA: Diagnosis not present

## 2017-05-24 DIAGNOSIS — Z23 Encounter for immunization: Secondary | ICD-10-CM | POA: Diagnosis not present

## 2017-05-24 DIAGNOSIS — I10 Essential (primary) hypertension: Secondary | ICD-10-CM | POA: Diagnosis not present

## 2017-05-24 DIAGNOSIS — K219 Gastro-esophageal reflux disease without esophagitis: Secondary | ICD-10-CM | POA: Diagnosis not present

## 2017-05-24 DIAGNOSIS — N182 Chronic kidney disease, stage 2 (mild): Secondary | ICD-10-CM | POA: Diagnosis not present

## 2017-05-24 DIAGNOSIS — E782 Mixed hyperlipidemia: Secondary | ICD-10-CM | POA: Diagnosis not present

## 2017-06-22 DIAGNOSIS — I739 Peripheral vascular disease, unspecified: Secondary | ICD-10-CM | POA: Diagnosis not present

## 2017-07-08 DIAGNOSIS — M25572 Pain in left ankle and joints of left foot: Secondary | ICD-10-CM | POA: Diagnosis not present

## 2017-07-15 DIAGNOSIS — R600 Localized edema: Secondary | ICD-10-CM | POA: Diagnosis not present

## 2017-07-20 DIAGNOSIS — R600 Localized edema: Secondary | ICD-10-CM | POA: Diagnosis not present

## 2017-07-20 DIAGNOSIS — R443 Hallucinations, unspecified: Secondary | ICD-10-CM | POA: Diagnosis not present

## 2017-07-20 DIAGNOSIS — R609 Edema, unspecified: Secondary | ICD-10-CM | POA: Diagnosis not present

## 2017-07-26 DIAGNOSIS — M25579 Pain in unspecified ankle and joints of unspecified foot: Secondary | ICD-10-CM | POA: Diagnosis not present

## 2017-07-26 DIAGNOSIS — M79671 Pain in right foot: Secondary | ICD-10-CM | POA: Diagnosis not present

## 2017-07-28 DIAGNOSIS — M7989 Other specified soft tissue disorders: Secondary | ICD-10-CM | POA: Diagnosis not present

## 2017-08-09 DIAGNOSIS — M25579 Pain in unspecified ankle and joints of unspecified foot: Secondary | ICD-10-CM | POA: Diagnosis not present

## 2017-08-09 DIAGNOSIS — M79671 Pain in right foot: Secondary | ICD-10-CM | POA: Diagnosis not present

## 2017-09-09 DIAGNOSIS — R443 Hallucinations, unspecified: Secondary | ICD-10-CM | POA: Diagnosis not present

## 2017-09-09 DIAGNOSIS — Z7189 Other specified counseling: Secondary | ICD-10-CM | POA: Diagnosis not present

## 2017-09-09 DIAGNOSIS — F2089 Other schizophrenia: Secondary | ICD-10-CM | POA: Diagnosis not present

## 2017-09-14 DIAGNOSIS — I739 Peripheral vascular disease, unspecified: Secondary | ICD-10-CM | POA: Diagnosis not present

## 2017-09-20 DIAGNOSIS — R7301 Impaired fasting glucose: Secondary | ICD-10-CM | POA: Diagnosis not present

## 2017-09-20 DIAGNOSIS — I10 Essential (primary) hypertension: Secondary | ICD-10-CM | POA: Diagnosis not present

## 2017-09-20 DIAGNOSIS — E875 Hyperkalemia: Secondary | ICD-10-CM | POA: Diagnosis not present

## 2017-09-20 DIAGNOSIS — N183 Chronic kidney disease, stage 3 (moderate): Secondary | ICD-10-CM | POA: Diagnosis not present

## 2017-09-23 DIAGNOSIS — N182 Chronic kidney disease, stage 2 (mild): Secondary | ICD-10-CM | POA: Diagnosis not present

## 2017-09-23 DIAGNOSIS — K219 Gastro-esophageal reflux disease without esophagitis: Secondary | ICD-10-CM | POA: Diagnosis not present

## 2017-09-23 DIAGNOSIS — E782 Mixed hyperlipidemia: Secondary | ICD-10-CM | POA: Diagnosis not present

## 2017-09-23 DIAGNOSIS — I10 Essential (primary) hypertension: Secondary | ICD-10-CM | POA: Diagnosis not present

## 2017-11-23 DIAGNOSIS — I739 Peripheral vascular disease, unspecified: Secondary | ICD-10-CM | POA: Diagnosis not present

## 2017-12-01 DIAGNOSIS — H02109 Unspecified ectropion of unspecified eye, unspecified eyelid: Secondary | ICD-10-CM | POA: Diagnosis not present

## 2017-12-01 DIAGNOSIS — H25813 Combined forms of age-related cataract, bilateral: Secondary | ICD-10-CM | POA: Diagnosis not present

## 2018-01-06 DIAGNOSIS — E878 Other disorders of electrolyte and fluid balance, not elsewhere classified: Secondary | ICD-10-CM | POA: Diagnosis not present

## 2018-01-06 DIAGNOSIS — N182 Chronic kidney disease, stage 2 (mild): Secondary | ICD-10-CM | POA: Diagnosis not present

## 2018-01-06 DIAGNOSIS — E875 Hyperkalemia: Secondary | ICD-10-CM | POA: Diagnosis not present

## 2018-01-06 DIAGNOSIS — F2089 Other schizophrenia: Secondary | ICD-10-CM | POA: Diagnosis not present

## 2018-01-10 DIAGNOSIS — K219 Gastro-esophageal reflux disease without esophagitis: Secondary | ICD-10-CM | POA: Diagnosis not present

## 2018-01-10 DIAGNOSIS — I10 Essential (primary) hypertension: Secondary | ICD-10-CM | POA: Diagnosis not present

## 2018-01-10 DIAGNOSIS — E782 Mixed hyperlipidemia: Secondary | ICD-10-CM | POA: Diagnosis not present

## 2018-01-10 DIAGNOSIS — N182 Chronic kidney disease, stage 2 (mild): Secondary | ICD-10-CM | POA: Diagnosis not present

## 2018-02-07 DIAGNOSIS — I739 Peripheral vascular disease, unspecified: Secondary | ICD-10-CM | POA: Diagnosis not present

## 2018-03-07 DIAGNOSIS — H25813 Combined forms of age-related cataract, bilateral: Secondary | ICD-10-CM | POA: Diagnosis not present

## 2018-03-07 DIAGNOSIS — M109 Gout, unspecified: Secondary | ICD-10-CM | POA: Diagnosis not present

## 2018-03-07 DIAGNOSIS — Z6825 Body mass index (BMI) 25.0-25.9, adult: Secondary | ICD-10-CM | POA: Diagnosis not present

## 2018-03-07 DIAGNOSIS — E875 Hyperkalemia: Secondary | ICD-10-CM | POA: Diagnosis not present

## 2018-03-07 DIAGNOSIS — F2089 Other schizophrenia: Secondary | ICD-10-CM | POA: Diagnosis not present

## 2018-03-21 NOTE — Patient Instructions (Addendum)
Your procedure is scheduled on: 03/28/2018   [facility] to [facility] at   700  AM.  Call this number if you have problems the morning of surgery: [phone]   Do not eat food or drink liquids :After Midnight.      Take these medicines the morning of surgery with A SIP OF WATER:  Protonix, dilantin, seroquel.   Do not wear jewelry, make-up or nail polish.  Do not wear lotions, powders, or perfumes. You may wear deodorant.  Do not shave 48 hours prior to surgery.  Do not bring valuables to the hospital.  Contacts, dentures or bridgework may not be worn into surgery.  Leave suitcase in the car. After surgery it may be brought to your room.  For patients admitted to the hospital, checkout time is 11:00 AM the day of discharge.   Patients discharged the day of surgery will not be allowed to drive home.  :     Please read over the following fact sheets that you were given: Coughing and Deep Breathing, Surgical Site Infection Prevention, Anesthesia Post-op Instructions and Care and Recovery After Surgery    Cataract A cataract is a clouding of the lens of the eye. When a lens becomes cloudy, vision is reduced based on the degree and nature of the clouding. Many cataracts reduce vision to some degree. Some cataracts make people more near-sighted as they develop. Other cataracts increase glare. Cataracts that are ignored and become worse can sometimes look white. The white color can be seen through the pupil. CAUSES   Aging. However, cataracts may occur at any age, even in newborns.   Certain drugs.   Trauma to the eye.   Certain diseases such as diabetes.   Specific eye diseases such as chronic inflammation inside the eye or a sudden attack of a rare form of glaucoma.   Inherited or acquired medical problems.  SYMPTOMS   Gradual, progressive drop in vision in the affected eye.   Severe, rapid visual loss. This most often happens when trauma is the cause.  DIAGNOSIS  To detect a  cataract, an eye doctor examines the lens. Cataracts are best diagnosed with an exam of the eyes with the pupils enlarged (dilated) by drops.  TREATMENT  For an early cataract, vision may improve by using different eyeglasses or stronger lighting. If that does not help your vision, surgery is the only effective treatment. A cataract needs to be surgically removed when vision loss interferes with your everyday activities, such as driving, reading, or watching TV. A cataract may also have to be removed if it prevents examination or treatment of another eye problem. Surgery removes the cloudy lens and usually replaces it with a substitute lens (intraocular lens, IOL).  At a time when both you and your doctor agree, the cataract will be surgically removed. If you have cataracts in both eyes, only one is usually removed at a time. This allows the operated eye to heal and be out of danger from any possible problems after surgery (such as infection or poor wound healing). In rare cases, a cataract may be doing damage to your eye. In these cases, your caregiver may advise surgical removal right away. The vast majority of people who have cataract surgery have better vision afterward. HOME CARE INSTRUCTIONS  If you are not planning surgery, you may be asked to do the following:  Use different eyeglasses.   Use stronger or brighter lighting.   Ask your eye doctor about  reducing your medicine dose or changing medicines if it is thought that a medicine caused your cataract. Changing medicines does not make the cataract go away on its own.   Become familiar with your surroundings. Poor vision can lead to injury. Avoid bumping into things on the affected side. You are at a higher risk for tripping or falling.   Exercise extreme care when driving or operating machinery.   Wear sunglasses if you are sensitive to bright light or experiencing problems with glare.  SEEK IMMEDIATE MEDICAL CARE IF:   You have a  worsening or sudden vision loss.   You notice redness, swelling, or increasing pain in the eye.   You have a fever.  Document Released: 08/24/2005 Document Revised: 08/13/2011 Document Reviewed: 04/17/2011 [facility] Patient Information 2012 [place].PATIENT INSTRUCTIONS POST-ANESTHESIA  IMMEDIATELY FOLLOWING SURGERY:  Do not drive or operate machinery for the first twenty four hours after surgery.  Do not make any important decisions for twenty four hours after surgery or while taking narcotic pain medications or sedatives.  If you develop intractable nausea and vomiting or a severe headache please notify your doctor immediately.  FOLLOW-UP:  Please make an appointment with your surgeon as instructed. You do not need to follow up with anesthesia unless specifically instructed to do so.  WOUND CARE INSTRUCTIONS (if applicable):  Keep a dry clean dressing on the anesthesia/puncture wound site if there is drainage.  Once the wound has quit draining you may leave it open to air.  Generally you should leave the bandage intact for twenty four hours unless there is drainage.  If the epidural site drains for more than 36-48 hours please call the anesthesia department.  QUESTIONS?:  Please feel free to call your physician or the hospital operator if you have any questions, and they will be happy to assist you.

## 2018-03-22 ENCOUNTER — Encounter (HOSPITAL_COMMUNITY)
Admission: RE | Admit: 2018-03-22 | Discharge: 2018-03-22 | Disposition: A | Discharge status: Home / Self Care | Payer: Medicare Other | Source: Ambulatory Visit | Attending: Ophthalmology | Admitting: Ophthalmology

## 2018-03-22 ENCOUNTER — Encounter (HOSPITAL_COMMUNITY): Payer: Self-pay

## 2018-03-22 ENCOUNTER — Other Ambulatory Visit: Payer: Self-pay

## 2018-03-22 DIAGNOSIS — Z7902 Long term (current) use of antithrombotics/antiplatelets: Secondary | ICD-10-CM | POA: Diagnosis not present

## 2018-03-22 DIAGNOSIS — S4991XA Unspecified injury of right shoulder and upper arm, initial encounter: Secondary | ICD-10-CM | POA: Diagnosis not present

## 2018-03-22 DIAGNOSIS — Y998 Other external cause status: Secondary | ICD-10-CM | POA: Diagnosis not present

## 2018-03-22 DIAGNOSIS — Y93F2 Activity, caregiving, lifting: Secondary | ICD-10-CM | POA: Diagnosis not present

## 2018-03-22 DIAGNOSIS — Z8673 Personal history of transient ischemic attack (TIA), and cerebral infarction without residual deficits: Secondary | ICD-10-CM | POA: Diagnosis not present

## 2018-03-22 DIAGNOSIS — S82831A Other fracture of upper and lower end of right fibula, initial encounter for closed fracture: Secondary | ICD-10-CM | POA: Diagnosis not present

## 2018-03-22 DIAGNOSIS — Z79899 Other long term (current) drug therapy: Secondary | ICD-10-CM | POA: Diagnosis not present

## 2018-03-22 DIAGNOSIS — S79911A Unspecified injury of right hip, initial encounter: Secondary | ICD-10-CM | POA: Diagnosis not present

## 2018-03-22 DIAGNOSIS — S8991XA Unspecified injury of right lower leg, initial encounter: Secondary | ICD-10-CM | POA: Diagnosis not present

## 2018-03-22 DIAGNOSIS — Y92129 Unspecified place in nursing home as the place of occurrence of the external cause: Secondary | ICD-10-CM | POA: Diagnosis not present

## 2018-03-22 DIAGNOSIS — S99911A Unspecified injury of right ankle, initial encounter: Secondary | ICD-10-CM | POA: Diagnosis not present

## 2018-03-22 DIAGNOSIS — W050XXA Fall from non-moving wheelchair, initial encounter: Secondary | ICD-10-CM | POA: Diagnosis not present

## 2018-03-22 HISTORY — DX: Edema, unspecified: R60.9

## 2018-03-22 HISTORY — DX: Gastro-esophageal reflux disease without esophagitis: K21.9

## 2018-03-22 HISTORY — DX: Hyperlipidemia, unspecified: E78.5

## 2018-03-22 HISTORY — DX: Cerebral infarction, unspecified: I63.9

## 2018-03-22 LAB — BASIC METABOLIC PANEL
Anion gap: 7 (ref 5–15)
BUN: 13 mg/dL (ref 8–23)
CALCIUM: 8.7 mg/dL — AB (ref 8.9–10.3)
CHLORIDE: 104 mmol/L (ref 98–111)
CO2: 27 mmol/L (ref 22–32)
CREATININE: 1.21 mg/dL — AB (ref 0.44–1.00)
GFR calc non Af Amer: 42 mL/min — ABNORMAL LOW (ref >60)
GFR, EST AFRICAN AMERICAN: 49 mL/min — AB (ref >60)
Glucose, Bld: 99 mg/dL (ref 70–99)
Potassium: 3.7 mmol/L (ref 3.5–5.1)
Sodium: 138 mmol/L (ref 135–145)

## 2018-03-22 LAB — CBC WITH DIFFERENTIAL/PLATELET
BASOS PCT: 1 %
Basophils Absolute: 0.1 10*3/uL (ref 0.0–0.1)
Eosinophils Absolute: 0.5 10*3/uL (ref 0.0–0.7)
Eosinophils Relative: 5 %
HCT: 38 % (ref 36.0–46.0)
HEMOGLOBIN: 12.1 g/dL (ref 12.0–15.0)
LYMPHS ABS: 3 10*3/uL (ref 0.7–4.0)
Lymphocytes Relative: 27 %
MCH: 30.3 pg (ref 26.0–34.0)
MCHC: 31.8 g/dL (ref 30.0–36.0)
MCV: 95.2 fL (ref 78.0–100.0)
MONO ABS: 1.5 10*3/uL — AB (ref 0.1–1.0)
MONOS PCT: 14 %
Neutro Abs: 5.8 10*3/uL (ref 1.7–7.7)
Neutrophils Relative %: 53 %
Platelets: 267 10*3/uL (ref 150–400)
RBC: 3.99 MIL/uL (ref 3.87–5.11)
RDW: 14.1 % (ref 11.5–15.5)
WBC: 10.8 10*3/uL — ABNORMAL HIGH (ref 4.0–10.5)

## 2018-03-23 ENCOUNTER — Encounter (HOSPITAL_COMMUNITY): Payer: Self-pay | Admitting: Adult Health

## 2018-03-23 ENCOUNTER — Observation Stay (HOSPITAL_COMMUNITY)
Admission: EM | Admit: 2018-03-23 | Discharge: 2018-03-25 | Disposition: A | Discharge status: Home / Self Care | Payer: Medicare Other | Attending: Internal Medicine | Admitting: Internal Medicine

## 2018-03-23 ENCOUNTER — Other Ambulatory Visit: Payer: Self-pay

## 2018-03-23 ENCOUNTER — Emergency Department (HOSPITAL_COMMUNITY): Payer: Medicare Other

## 2018-03-23 DIAGNOSIS — S82831A Other fracture of upper and lower end of right fibula, initial encounter for closed fracture: Principal | ICD-10-CM | POA: Insufficient documentation

## 2018-03-23 DIAGNOSIS — Y93F2 Activity, caregiving, lifting: Secondary | ICD-10-CM | POA: Insufficient documentation

## 2018-03-23 DIAGNOSIS — S069X9A Unspecified intracranial injury with loss of consciousness of unspecified duration, initial encounter: Secondary | ICD-10-CM

## 2018-03-23 DIAGNOSIS — S80919A Unspecified superficial injury of unspecified knee, initial encounter: Secondary | ICD-10-CM | POA: Diagnosis not present

## 2018-03-23 DIAGNOSIS — W050XXA Fall from non-moving wheelchair, initial encounter: Secondary | ICD-10-CM | POA: Insufficient documentation

## 2018-03-23 DIAGNOSIS — S069XAA Unspecified intracranial injury with loss of consciousness status unknown, initial encounter: Secondary | ICD-10-CM | POA: Diagnosis present

## 2018-03-23 DIAGNOSIS — N183 Chronic kidney disease, stage 3 unspecified: Secondary | ICD-10-CM

## 2018-03-23 DIAGNOSIS — S99911A Unspecified injury of right ankle, initial encounter: Secondary | ICD-10-CM | POA: Diagnosis not present

## 2018-03-23 DIAGNOSIS — S82891A Other fracture of right lower leg, initial encounter for closed fracture: Secondary | ICD-10-CM

## 2018-03-23 DIAGNOSIS — Z7902 Long term (current) use of antithrombotics/antiplatelets: Secondary | ICD-10-CM | POA: Insufficient documentation

## 2018-03-23 DIAGNOSIS — W19XXXA Unspecified fall, initial encounter: Secondary | ICD-10-CM

## 2018-03-23 DIAGNOSIS — Y998 Other external cause status: Secondary | ICD-10-CM | POA: Insufficient documentation

## 2018-03-23 DIAGNOSIS — S79911A Unspecified injury of right hip, initial encounter: Secondary | ICD-10-CM | POA: Diagnosis not present

## 2018-03-23 DIAGNOSIS — Z79899 Other long term (current) drug therapy: Secondary | ICD-10-CM | POA: Insufficient documentation

## 2018-03-23 DIAGNOSIS — I639 Cerebral infarction, unspecified: Secondary | ICD-10-CM

## 2018-03-23 DIAGNOSIS — Z8673 Personal history of transient ischemic attack (TIA), and cerebral infarction without residual deficits: Secondary | ICD-10-CM | POA: Insufficient documentation

## 2018-03-23 DIAGNOSIS — K219 Gastro-esophageal reflux disease without esophagitis: Secondary | ICD-10-CM

## 2018-03-23 DIAGNOSIS — S4991XA Unspecified injury of right shoulder and upper arm, initial encounter: Secondary | ICD-10-CM | POA: Diagnosis not present

## 2018-03-23 DIAGNOSIS — R410 Disorientation, unspecified: Secondary | ICD-10-CM | POA: Diagnosis not present

## 2018-03-23 DIAGNOSIS — Y92129 Unspecified place in nursing home as the place of occurrence of the external cause: Secondary | ICD-10-CM | POA: Insufficient documentation

## 2018-03-23 DIAGNOSIS — R4020 Unspecified coma: Secondary | ICD-10-CM | POA: Diagnosis not present

## 2018-03-23 DIAGNOSIS — S8991XA Unspecified injury of right lower leg, initial encounter: Secondary | ICD-10-CM | POA: Diagnosis not present

## 2018-03-23 DIAGNOSIS — R0902 Hypoxemia: Secondary | ICD-10-CM | POA: Diagnosis not present

## 2018-03-23 DIAGNOSIS — S82899A Other fracture of unspecified lower leg, initial encounter for closed fracture: Secondary | ICD-10-CM | POA: Diagnosis present

## 2018-03-23 DIAGNOSIS — E785 Hyperlipidemia, unspecified: Secondary | ICD-10-CM

## 2018-03-23 DIAGNOSIS — S0190XA Unspecified open wound of unspecified part of head, initial encounter: Secondary | ICD-10-CM | POA: Diagnosis present

## 2018-03-23 LAB — BASIC METABOLIC PANEL
Anion gap: 9 (ref 5–15)
BUN: 17 mg/dL (ref 8–23)
CHLORIDE: 101 mmol/L (ref 98–111)
CO2: 27 mmol/L (ref 22–32)
Calcium: 8.8 mg/dL — ABNORMAL LOW (ref 8.9–10.3)
Creatinine, Ser: 1.4 mg/dL — ABNORMAL HIGH (ref 0.44–1.00)
GFR calc non Af Amer: 35 mL/min — ABNORMAL LOW (ref >60)
GFR, EST AFRICAN AMERICAN: 41 mL/min — AB (ref >60)
Glucose, Bld: 116 mg/dL — ABNORMAL HIGH (ref 70–99)
POTASSIUM: 4 mmol/L (ref 3.5–5.1)
SODIUM: 137 mmol/L (ref 135–145)

## 2018-03-23 LAB — PROTIME-INR
INR: 1.01
PROTHROMBIN TIME: 13.2 s (ref 11.4–15.2)

## 2018-03-23 LAB — CBC
HCT: 35 % — ABNORMAL LOW (ref 36.0–46.0)
Hemoglobin: 11.3 g/dL — ABNORMAL LOW (ref 12.0–15.0)
MCH: 30.3 pg (ref 26.0–34.0)
MCHC: 32.3 g/dL (ref 30.0–36.0)
MCV: 93.8 fL (ref 78.0–100.0)
Platelets: 241 10*3/uL (ref 150–400)
RBC: 3.73 MIL/uL — ABNORMAL LOW (ref 3.87–5.11)
RDW: 14.3 % (ref 11.5–15.5)
WBC: 12.7 10*3/uL — ABNORMAL HIGH (ref 4.0–10.5)

## 2018-03-23 LAB — PHENYTOIN LEVEL, TOTAL: Phenytoin Lvl: 17.2 ug/mL (ref 10.0–20.0)

## 2018-03-23 MED ORDER — PANTOPRAZOLE SODIUM 20 MG PO TBEC
20.0000 mg | DELAYED_RELEASE_TABLET | Freq: Two times a day (BID) | ORAL | Status: DC
  Filled 2018-03-23 (×4): qty 1

## 2018-03-23 MED ORDER — HEPARIN SODIUM (PORCINE) 5000 UNIT/ML IJ SOLN
5000.0000 [IU] | Freq: Three times a day (TID) | INTRAMUSCULAR | Status: DC
  Administered 2018-03-23 – 2018-03-25 (×5): 5000 [IU] via SUBCUTANEOUS
  Filled 2018-03-23 (×5): qty 1

## 2018-03-23 MED ORDER — FUROSEMIDE 40 MG PO TABS
20.0000 mg | ORAL_TABLET | Freq: Every day | ORAL | Status: DC
  Administered 2018-03-24 – 2018-03-25 (×2): 20 mg via ORAL
  Filled 2018-03-23 (×2): qty 1

## 2018-03-23 MED ORDER — SODIUM CHLORIDE 0.9 % IV SOLN: 250.0000 mL | INTRAVENOUS | Status: DC | PRN

## 2018-03-23 MED ORDER — ACETAMINOPHEN 325 MG PO TABS
650.0000 mg | ORAL_TABLET | Freq: Four times a day (QID) | ORAL | Status: DC | PRN
  Administered 2018-03-24 (×2): 650 mg via ORAL
  Filled 2018-03-23 (×2): qty 2

## 2018-03-23 MED ORDER — CVS SPECTRAVITE ADVANCED PO TABS: ORAL_TABLET | Freq: Every day | ORAL | Status: DC

## 2018-03-23 MED ORDER — POTASSIUM CHLORIDE CRYS ER 20 MEQ PO TBCR
20.0000 meq | EXTENDED_RELEASE_TABLET | ORAL | Status: DC
  Administered 2018-03-23 – 2018-03-25 (×4): 20 meq via ORAL
  Filled 2018-03-23 (×4): qty 1

## 2018-03-23 MED ORDER — SODIUM CHLORIDE 0.9% FLUSH: 3.0000 mL | INTRAVENOUS | Status: DC | PRN

## 2018-03-23 MED ORDER — ATORVASTATIN CALCIUM 40 MG PO TABS
40.0000 mg | ORAL_TABLET | Freq: Every day | ORAL | Status: DC
  Administered 2018-03-23 – 2018-03-24 (×2): 40 mg via ORAL
  Filled 2018-03-23 (×2): qty 1

## 2018-03-23 MED ORDER — FENTANYL CITRATE (PF) 100 MCG/2ML IJ SOLN: 50.0000 ug | Freq: Once | INTRAMUSCULAR | Status: DC

## 2018-03-23 MED ORDER — PHENYTOIN SODIUM EXTENDED 100 MG PO CAPS
100.0000 mg | ORAL_CAPSULE | Freq: Three times a day (TID) | ORAL | Status: DC
  Administered 2018-03-23 – 2018-03-25 (×5): 100 mg via ORAL
  Filled 2018-03-23 (×5): qty 1

## 2018-03-23 MED ORDER — ACETAMINOPHEN 650 MG RE SUPP: 650.0000 mg | Freq: Four times a day (QID) | RECTAL | Status: DC | PRN

## 2018-03-23 MED ORDER — FLUTICASONE PROPIONATE 50 MCG/ACT NA SUSP: 1.0000 | Freq: Every day | NASAL | Status: DC | PRN

## 2018-03-23 MED ORDER — TAB-A-VITE/IRON PO TABS: 1.0000 | ORAL_TABLET | Freq: Every day | ORAL | Status: DC

## 2018-03-23 MED ORDER — CALCIUM CARBONATE-VITAMIN D 500-200 MG-UNIT PO TABS
1.0000 | ORAL_TABLET | Freq: Two times a day (BID) | ORAL | Status: DC
  Administered 2018-03-23 – 2018-03-25 (×4): 1 via ORAL
  Filled 2018-03-23 (×14): qty 1

## 2018-03-23 MED ORDER — HYDROCODONE-ACETAMINOPHEN 5-325 MG PO TABS
1.0000 | ORAL_TABLET | ORAL | Status: DC | PRN
  Administered 2018-03-25: 1 via ORAL
  Filled 2018-03-23: qty 1

## 2018-03-23 MED ORDER — CLOPIDOGREL BISULFATE 75 MG PO TABS
75.0000 mg | ORAL_TABLET | Freq: Every day | ORAL | Status: DC
  Administered 2018-03-24 – 2018-03-25 (×2): 75 mg via ORAL
  Filled 2018-03-23 (×2): qty 1

## 2018-03-23 MED ORDER — ONDANSETRON HCL 4 MG PO TABS
4.0000 mg | ORAL_TABLET | Freq: Four times a day (QID) | ORAL | Status: DC | PRN
  Administered 2018-03-24: 4 mg via ORAL
  Filled 2018-03-23: qty 1

## 2018-03-23 MED ORDER — FOLIC ACID 1 MG PO TABS
1.0000 mg | ORAL_TABLET | Freq: Every day | ORAL | Status: DC
  Administered 2018-03-24 – 2018-03-25 (×2): 1 mg via ORAL
  Filled 2018-03-23 (×2): qty 1

## 2018-03-23 MED ORDER — ONDANSETRON HCL 4 MG/2ML IJ SOLN: 4.0000 mg | Freq: Four times a day (QID) | INTRAMUSCULAR | Status: DC | PRN

## 2018-03-23 MED ORDER — PHENOBARBITAL 32.4 MG PO TABS
32.4000 mg | ORAL_TABLET | Freq: Every day | ORAL | Status: DC
  Administered 2018-03-23 – 2018-03-24 (×2): 32.4 mg via ORAL
  Filled 2018-03-23 (×2): qty 1

## 2018-03-23 MED ORDER — QUETIAPINE FUMARATE 100 MG PO TABS
100.0000 mg | ORAL_TABLET | Freq: Two times a day (BID) | ORAL | Status: DC
  Administered 2018-03-23 – 2018-03-25 (×4): 100 mg via ORAL
  Filled 2018-03-23 (×4): qty 1

## 2018-03-23 MED ORDER — SODIUM CHLORIDE 0.9% FLUSH
3.0000 mL | Freq: Two times a day (BID) | INTRAVENOUS | Status: DC
  Administered 2018-03-23 – 2018-03-25 (×4): 3 mL via INTRAVENOUS

## 2018-03-23 NOTE — ED Triage Notes (Signed)
Presents with right sided knee and lower leg pain that began post fall from wheelchair last night. She repoorts that the pain is severe and she was unable to get undressed last night or dress this AM due to pain. SHe lives at home with her sister and she helped her get into her wheelchair after the fall. The pain is worsening and there is a large of swelling to right knee, lower leg and ankle. She does not recall if she hit her head. 

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
## 2018-03-24 DIAGNOSIS — S82831A Other fracture of upper and lower end of right fibula, initial encounter for closed fracture: Secondary | ICD-10-CM

## 2018-03-24 DIAGNOSIS — S8264XA Nondisplaced fracture of lateral malleolus of right fibula, initial encounter for closed fracture: Secondary | ICD-10-CM

## 2018-03-24 DIAGNOSIS — W19XXXD Unspecified fall, subsequent encounter: Secondary | ICD-10-CM | POA: Diagnosis not present

## 2018-03-24 DIAGNOSIS — N183 Chronic kidney disease, stage 3 (moderate): Secondary | ICD-10-CM

## 2018-03-24 DIAGNOSIS — W19XXXA Unspecified fall, initial encounter: Secondary | ICD-10-CM | POA: Diagnosis not present

## 2018-03-24 DIAGNOSIS — E785 Hyperlipidemia, unspecified: Secondary | ICD-10-CM

## 2018-03-24 LAB — BASIC METABOLIC PANEL
ANION GAP: 7 (ref 5–15)
BUN: 18 mg/dL (ref 8–23)
CALCIUM: 8.8 mg/dL — AB (ref 8.9–10.3)
CO2: 27 mmol/L (ref 22–32)
Chloride: 106 mmol/L (ref 98–111)
Creatinine, Ser: 1.24 mg/dL — ABNORMAL HIGH (ref 0.44–1.00)
GFR calc Af Amer: 47 mL/min — ABNORMAL LOW (ref >60)
GFR, EST NON AFRICAN AMERICAN: 41 mL/min — AB (ref >60)
Glucose, Bld: 87 mg/dL (ref 70–99)
POTASSIUM: 4 mmol/L (ref 3.5–5.1)
SODIUM: 140 mmol/L (ref 135–145)

## 2018-03-24 LAB — CBC
HEMATOCRIT: 32.6 % — AB (ref 36.0–46.0)
HEMOGLOBIN: 10.6 g/dL — AB (ref 12.0–15.0)
MCH: 30.4 pg (ref 26.0–34.0)
MCHC: 32.5 g/dL (ref 30.0–36.0)
MCV: 93.4 fL (ref 78.0–100.0)
Platelets: 200 10*3/uL (ref 150–400)
RBC: 3.49 MIL/uL — AB (ref 3.87–5.11)
RDW: 14.5 % (ref 11.5–15.5)
WBC: 11.5 10*3/uL — AB (ref 4.0–10.5)

## 2018-03-24 MED ORDER — PANTOPRAZOLE SODIUM 40 MG PO TBEC
40.0000 mg | DELAYED_RELEASE_TABLET | Freq: Every day | ORAL | Status: DC
  Administered 2018-03-24 – 2018-03-25 (×2): 40 mg via ORAL
  Filled 2018-03-24 (×2): qty 1

## 2018-03-24 NOTE — Progress Notes (Signed)
Patient ID: [name], female   DOB: September 19, 1940, 77 y.o.   MRN: [id number] xrays reviewed   Minimally displaced fracture right fibula   wbat in cam walker

## 2018-03-24 NOTE — Progress Notes (Signed)
OT Cancellation Note  Patient Details Name: Catherine Barrett MRN: [id number] DOB: March 25, 1941   Cancelled Treatment:    Reason Eval/Treat Not Completed: Other (comment). Pt family present on OT arrival, sister requested rehab wait until after breakfast to see pt as pt had a rough night, waking about 3am in severe pain and just falling back to sleep this morning before OT arrived. Sister reports pt lives alone with family next door, has aide 4 hours/day 5 days/week. Pt is independent in dressing, supervision/min assist for bathing. Uses manual wheelchair and is able to transfer independently, does not ambulate. Will attempt to see pt this afternoon or tomorrow morning.  Ezra SitesLeslie Yves Fodor, OTR/L  [phone] 03/24/2018, 8:40 AM

## 2018-03-24 NOTE — Care Management Obs Status (Signed)
MEDICARE OBSERVATION STATUS NOTIFICATION   Patient Details  Name: Catherine Barrett MRN: [id number] Date of Birth: 1941-03-18   Medicare Observation Status Notification Given:  Yes    Malcolm MetroChildress, Kendale Rembold Demske, RN 03/24/2018, 11:58 AM
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
## 2018-03-24 NOTE — Plan of Care (Signed)
  Problem: Acute Rehab PT Goals(only PT should resolve) Goal: Pt Will Go Supine/Side To Sit Outcome: Progressing Flowsheets (Taken 03/24/2018 1148) Pt will go Supine/Side to Sit: with minimal assist Goal: Patient Will Transfer Sit To/From Stand Outcome: Progressing Flowsheets (Taken 03/24/2018 1148) Patient will transfer sit to/from stand: with moderate assist Goal: Pt Will Transfer Bed To Chair/Chair To Bed Outcome: Progressing Flowsheets (Taken 03/24/2018 1148) Pt will Transfer Bed to Chair/Chair to Bed: with mod assist  11:48 AM, 03/24/18 Ocie BobJames Topher [place]en[place], MPT Physical Therapist [facility] [phone] office [phone] mobile phone

## 2018-03-24 NOTE — [place] FL2 (Signed)
[place] MEDICAID FL2 LEVEL OF CARE SCREENING TOOL     IDENTIFICATION  Patient Name: Catherine Barrett Birthdate: 17-Feb-1941 Sex: female Admission Date (Current Location): 03/23/2018  [facility] and [place] Number:  [employer] and Address:  [facility],  [address] [place] [address]      Provider Number: [id number]  Attending Physician Name and Address:  Catarina Hartshorn, MD  Relative Name and Phone Number:  Wendall Mola (sister) [phone]    Current Level of Care: SNF Recommended Level of Care: Skilled Nursing Facility Prior Approval Number:    Date Approved/Denied:   PASRR Number: [id number]  Discharge Plan: SNF    Current Diagnoses: Patient Active Problem List   Diagnosis Date Noted  . Closed fracture of right distal fibula   . CVA (cerebral vascular accident) (HCC) 03/23/2018  . GERD (gastroesophageal reflux disease) 03/23/2018  . Dyslipidemia 03/23/2018  . Ankle fracture, right, closed, initial encounter 03/23/2018  . CKD (chronic kidney disease) stage 3, GFR 30-59 ml/min (HCC) 03/23/2018  . Ankle fracture 03/23/2018  . Fall 03/17/2011  . Traumatic brain injury, open (HCC) 03/17/2011    Orientation RESPIRATION BLADDER Height & Weight     Self, Situation, Place  Normal Incontinent Weight: 136 lb 3.9 oz (61.8 kg) Height:  5\' 2"  (157.5 cm)  BEHAVIORAL SYMPTOMS/MOOD NEUROLOGICAL BOWEL NUTRITION STATUS      Continent Diet(see dc summary)  AMBULATORY STATUS COMMUNICATION OF NEEDS Skin   Extensive Assist Verbally PU Stage and Appropriate Care(Buttocks) PU Stage 1 Dressing: (see dc summary)                     Personal Care Assistance Level of Assistance  Bathing, Feeding, Dressing Bathing Assistance: Limited assistance Feeding assistance: Independent Dressing Assistance: Limited assistance     Functional Limitations Info  Sight, Hearing, Speech Sight Info: Adequate Hearing Info: Adequate Speech Info: Adequate    SPECIAL CARE  FACTORS FREQUENCY  PT (By licensed PT), OT (By licensed OT)     PT Frequency: 5x/week OT Frequency: 3x/week            Contractures Contractures Info: Not present    Additional Factors Info  Code Status, Allergies, Psychotropic Code Status Info: full Allergies Info: Aspirin Psychotropic Info: Seroquel         Current Medications (03/24/2018):  This is the current hospital active medication list Current Facility-Administered Medications  Medication Dose Route Frequency Provider Last Rate Last Dose  . 0.9 %  sodium chloride infusion  250 mL Intravenous PRN Sherryll Burger, Pratik D, DO      . acetaminophen (TYLENOL) tablet 650 mg  650 mg Oral Q6H PRN Maurilio Lovely D, DO   650 mg at 03/24/18 0246   Or  . acetaminophen (TYLENOL) suppository 650 mg  650 mg Rectal Q6H PRN Sherryll Burger, Pratik D, DO      . atorvastatin (LIPITOR) tablet 40 mg  40 mg Oral QHS Shah, Pratik D, DO   40 mg at 03/23/18 2148  . calcium-vitamin D (OSCAL WITH D) 500-200 MG-UNIT per tablet 1 tablet  1 tablet Oral BID Sherryll Burger, Pratik D, DO   1 tablet at 03/24/18 [phone]  . clopidogrel (PLAVIX) tablet 75 mg  75 mg Oral Q1400 Sherryll Burger, Pratik D, DO      . fluticasone (FLONASE) 50 MCG/ACT nasal spray 1 spray  1 spray Each Nare Daily PRN Sherryll Burger, Pratik D, DO      . folic acid (FOLVITE) tablet 1 mg  1  mg Oral Q breakfast Maurilio LovelyShah, Pratik D, DO   1 mg at 03/24/18 [id number]  . furosemide (LASIX) tablet 20 mg  20 mg Oral Q breakfast Maurilio LovelyShah, Pratik D, DO   20 mg at 03/24/18 0838  . heparin injection 5,000 Units  5,000 Units Subcutaneous Q8H Shah, Pratik D, DO   5,000 Units at 03/24/18 0517  . HYDROcodone-acetaminophen (NORCO/VICODIN) 5-325 MG per tablet 1-2 tablet  1-2 tablet Oral Q4H PRN Sherryll BurgerShah, Pratik D, DO      . multivitamins with iron tablet 1 tablet  1 tablet Oral Daily Sherryll BurgerShah, Pratik D, DO      . ondansetron (ZOFRAN) tablet 4 mg  4 mg Oral Q6H PRN Sherryll BurgerShah, Pratik D, DO       Or  . ondansetron (ZOFRAN) injection 4 mg  4 mg Intravenous Q6H PRN Sherryll BurgerShah, Pratik D, DO       . pantoprazole (PROTONIX) EC tablet 40 mg  40 mg Oral Daily Tat, Onalee Huaavid, MD   40 mg at 03/24/18 0839  . PHENobarbital (LUMINAL) tablet 32.4 mg  32.4 mg Oral QHS Shah, Pratik D, DO   32.4 mg at 03/23/18 2148  . phenytoin (DILANTIN) ER capsule 100 mg  100 mg Oral TID Sherryll BurgerShah, Pratik D, DO   100 mg at 03/24/18 [id number]  . potassium chloride SA (K-DUR,KLOR-CON) CR tablet 20 mEq  20 mEq Oral 2 times per day Maurilio LovelyShah, Pratik D, DO   20 mEq at 03/23/18 2148  . QUEtiapine (SEROQUEL) tablet 100 mg  100 mg Oral BID Sherryll BurgerShah, Pratik D, DO   100 mg at 03/24/18 [id number]  . sodium chloride flush (NS) 0.9 % injection 3 mL  3 mL Intravenous Q12H Shah, Pratik D, DO   3 mL at 03/24/18 0839  . sodium chloride flush (NS) 0.9 % injection 3 mL  3 mL Intravenous PRN Maurilio LovelyShah, Pratik D, DO         Discharge Medications: Please see discharge summary for a list of discharge medications.  Relevant Imaging Results:  Relevant Lab Results:   Additional Information SSN: [id number]8111  Joaquin Knebel, LCSW

## 2018-03-24 NOTE — Clinical Social Work Note (Signed)
Clinical Social Work Assessment  Patient Details  Name: Catherine Barrett MRN: [id number] Date of Birth: Feb 22, 1941  Date of referral:  03/24/18               Reason for consult:  Facility Placement, Discharge Planning                Permission sought to share information with:  Chartered certified accountant granted to share information::     Name::        Agency::  [facility], [place], [facility] [place], [place]  Relationship::     Contact Information:     Housing/Transportation Living arrangements for the past 2 months:  [place] of Information:  Patient, Siblings Patient Interpreter Needed:  None Criminal Activity/Legal Involvement Pertinent to Current Situation/Hospitalization:  No - Comment as needed Significant Relationships:  Siblings Lives with:  Self Do you feel safe going back to the place where you live?  Yes Need for family participation in patient care:  Yes (Comment)  Care giving concerns: PT recommends SNF Rehab   Social Worker assessment / plan: Pt is a 77 year old female referred to CSW for SNF Rehab placement. Met with pt and one of her sisters, Catherine Barrett, in the room today. Pt is pleasant and alert and oriented x4. She is somewhat forgetful. Pt lives alone and is wheelchair bound. She is normally independent in transfers at home. She has help from family and a caregiver when at home. Discussed SNF rehab with pt who states she only wants to go for two weeks because she has a little dog at home that she is very close to and she needs to get home to take care of him. Pt and family request referrals to [place] and [facility]. Will start referrals and insurance authorization and will follow for dc planning.  Employment status:  Retired Nurse, adult PT Recommendations:  [place] / Referral to community resources:  [place]  Patient/Family's Response to care: Pt and family accepting of  care.  Patient/Family's Understanding of and Emotional Response to Diagnosis, Current Treatment, and Prognosis: Pt and family appears to have a good understanding of diagnosis and treatment recommendations. Pt agreeable to SNF but does seem concerned about her dog. Pt's sister emphasizes that they will take care of the dog while pt does her rehab.  Emotional Assessment Appearance:  Appears stated age Attitude/Demeanor/Rapport:  Engaged Affect (typically observed):  Calm, Pleasant Orientation:  Oriented to Self, Oriented to Place, Oriented to Situation Alcohol / Substance use:  Not Applicable Psych involvement (Current and /or in the community):  No (Comment)  Discharge Needs  Concerns to be addressed:  Discharge Planning Concerns Readmission within the last 30 days:  No Current discharge risk:  Lives alone, Physical Impairment Barriers to Discharge:  [place], LCSW 03/24/2018, 1:23 PM
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
## 2018-03-25 ENCOUNTER — Inpatient Hospital Stay
Admission: RE | Admit: 2018-03-25 | Discharge: 2018-04-14 | Disposition: A | Discharge status: Home / Self Care | Payer: Medicare Other | Source: Ambulatory Visit | Attending: Internal Medicine | Admitting: Internal Medicine

## 2018-03-25 ENCOUNTER — Telehealth: Payer: Self-pay | Admitting: Orthopedic Surgery

## 2018-03-25 DIAGNOSIS — K219 Gastro-esophageal reflux disease without esophagitis: Secondary | ICD-10-CM | POA: Diagnosis not present

## 2018-03-25 DIAGNOSIS — Z7902 Long term (current) use of antithrombotics/antiplatelets: Secondary | ICD-10-CM | POA: Diagnosis not present

## 2018-03-25 DIAGNOSIS — R279 Unspecified lack of coordination: Secondary | ICD-10-CM | POA: Diagnosis not present

## 2018-03-25 DIAGNOSIS — Z9181 History of falling: Secondary | ICD-10-CM | POA: Diagnosis not present

## 2018-03-25 DIAGNOSIS — Z79899 Other long term (current) drug therapy: Secondary | ICD-10-CM | POA: Diagnosis not present

## 2018-03-25 DIAGNOSIS — Z993 Dependence on wheelchair: Secondary | ICD-10-CM | POA: Diagnosis not present

## 2018-03-25 DIAGNOSIS — Z8673 Personal history of transient ischemic attack (TIA), and cerebral infarction without residual deficits: Secondary | ICD-10-CM | POA: Diagnosis not present

## 2018-03-25 DIAGNOSIS — S82831A Other fracture of upper and lower end of right fibula, initial encounter for closed fracture: Secondary | ICD-10-CM | POA: Diagnosis not present

## 2018-03-25 DIAGNOSIS — S82451D Displaced comminuted fracture of shaft of right fibula, subsequent encounter for closed fracture with routine healing: Secondary | ICD-10-CM | POA: Diagnosis not present

## 2018-03-25 DIAGNOSIS — E785 Hyperlipidemia, unspecified: Secondary | ICD-10-CM | POA: Diagnosis not present

## 2018-03-25 DIAGNOSIS — M6281 Muscle weakness (generalized): Secondary | ICD-10-CM | POA: Diagnosis not present

## 2018-03-25 DIAGNOSIS — N183 Chronic kidney disease, stage 3 (moderate): Secondary | ICD-10-CM | POA: Diagnosis not present

## 2018-03-25 DIAGNOSIS — S82891S Other fracture of right lower leg, sequela: Secondary | ICD-10-CM | POA: Diagnosis not present

## 2018-03-25 DIAGNOSIS — W19XXXD Unspecified fall, subsequent encounter: Secondary | ICD-10-CM | POA: Diagnosis not present

## 2018-03-25 DIAGNOSIS — G8191 Hemiplegia, unspecified affecting right dominant side: Secondary | ICD-10-CM | POA: Diagnosis not present

## 2018-03-25 DIAGNOSIS — S82891A Other fracture of right lower leg, initial encounter for closed fracture: Secondary | ICD-10-CM | POA: Diagnosis not present

## 2018-03-25 MED ORDER — HYDROCODONE-ACETAMINOPHEN 5-325 MG PO TABS: 1.0000 | ORAL_TABLET | ORAL | 0 refills | Status: DC | PRN

## 2018-03-25 NOTE — Clinical Social Work Placement (Signed)
   CLINICAL SOCIAL WORK PLACEMENT  NOTE  Date:  03/25/2018  Patient Details  Name: Abigail ButtsDoris L Parlato MRN: [id number] Date of Birth: [birth year]  Clinical Social Work is seeking post-discharge placement for this patient at the Skilled  Nursing Facility level of care (*CSW will initial, date and re-position this form in  chart as items are completed):  Yes   Patient/family provided with [facility] Social Work Department's list of facilities offering this level of care within the geographic area requested by the patient (or if unable, by the patient's family).  Yes   Patient/family informed of their freedom to choose among providers that offer the needed level of care, that participate in Medicare, Medicaid or managed care program needed by the patient, have an available bed and are willing to accept the patient.  Yes   Patient/family informed of [facility]'s ownership interest in [facility] and [facility], as well as of the fact that they are under no obligation to receive care at these facilities.  PASRR submitted to EDS on 03/24/18     PASRR number received on 03/24/18     Existing PASRR number confirmed on       FL2 transmitted to all facilities in geographic area requested by pt/family on 03/24/18     FL2 transmitted to all facilities within larger geographic area on       Patient informed that his/her managed care company has contracts with or will negotiate with certain facilities, including the following:        Yes   Patient/family informed of bed offers received.  [facility]     Physician recommends and patient chooses bed at      [facility] on  .  Patient to be transferred to facility by wheelchair     Patient family notified on 03/25/18 of transfer.  Name of family member notified:  patient notified. Family not available     PHYSICIAN       Additional Comment: [facility] RN. She will call report and assist with transfer. RN will update family when they arrive. CSW unable to reach family. No other CSW needs for dc.   _______________________________________________ Elliot GaultKathleen Rhealynn Myhre, LCSW 03/25/2018, 12:33 PM

## 2018-03-25 NOTE — Progress Notes (Signed)
OT Cancellation Note  Patient Details Name: Catherine Barrett MRN: [id number] DOB: [birth year]   Cancelled Treatment:    Reason Eval/Treat Not Completed: Patient declined, no reason specified;Other (comment). Pt prefers to wait until after breakfast for rehab services. OT unavailable this afternoon to see pt due to schedule. Pt is discharging to SNF, will defer OT evaluation to SNF.  Thank you for this referral.   Ezra SitesLeslie Leaner Morici, OTR/L  [phone] 03/25/2018, 8:59 AM

## 2018-03-25 NOTE — Clinical Social Work Note (Signed)
[facility] stating [facility] has [facility]F authorization starting today with continued stay review due on 03/27/18. Auth number is [id number]. RUG level is RVC with 561 minutes of therapy a week. Fax number for updates is [phone].   Updated MD. Will follow up with dc planning once dc orders are complete.

## 2018-03-25 NOTE — Progress Notes (Signed)
Patient is to be discharged to [facility] and in stable condition. Patient and family made aware of discharge and are agreeable. Patient's IV removed, WNL. Patient to be transported by staff through tunnel.  Quita SkyeMorgan P Dishmon, RN

## 2018-03-25 NOTE — Telephone Encounter (Signed)
-----   Message from Vickki HearingStanley E Harrison, MD sent at 03/25/2018  7:32 AM EDT ----- This patient needs an appointment for x-ray in 2 weeks  Please add to Dr. Jenetta DownerKeelings schedule thanks

## 2018-03-25 NOTE — Final Consult Note (Signed)
Right ankle fracture  CAM Walker weight-bear as tolerated  X-ray in 2 weeks  Call the office for appointment
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
## 2018-03-25 NOTE — Telephone Encounter (Signed)
Called patient to schedule appointment with Dr Hilda LiasKeeling, per Dr Mort SawyersHarrison's message as noted.  Spoke with family member, patient's sister; states [facility]. [place] SpanielSaid may be [facility] or other facility. Left message. (We will follow up next week to try to schedule)

## 2018-03-28 ENCOUNTER — Other Ambulatory Visit: Payer: Self-pay

## 2018-03-28 ENCOUNTER — Ambulatory Visit (HOSPITAL_COMMUNITY): Admission: RE | Admit: 2018-03-28 | Payer: Medicare Other | Source: Ambulatory Visit | Admitting: Ophthalmology

## 2018-03-28 ENCOUNTER — Encounter (HOSPITAL_COMMUNITY): Admission: RE | Payer: Self-pay | Source: Ambulatory Visit

## 2018-03-28 ENCOUNTER — Encounter: Payer: Self-pay | Admitting: Internal Medicine

## 2018-03-28 ENCOUNTER — Encounter (HOSPITAL_COMMUNITY)
Admission: RE | Admit: 2018-03-28 | Discharge: 2018-03-28 | Disposition: A | Discharge status: Home / Self Care | Payer: Medicare Other | Source: Skilled Nursing Facility | Attending: Internal Medicine | Admitting: Internal Medicine

## 2018-03-28 ENCOUNTER — Non-Acute Institutional Stay (SKILLED_NURSING_FACILITY): Payer: Medicare Other | Admitting: Internal Medicine

## 2018-03-28 DIAGNOSIS — K219 Gastro-esophageal reflux disease without esophagitis: Secondary | ICD-10-CM

## 2018-03-28 DIAGNOSIS — S82891S Other fracture of right lower leg, sequela: Secondary | ICD-10-CM

## 2018-03-28 DIAGNOSIS — Z9181 History of falling: Secondary | ICD-10-CM | POA: Insufficient documentation

## 2018-03-28 DIAGNOSIS — Z5189 Encounter for other specified aftercare: Secondary | ICD-10-CM | POA: Insufficient documentation

## 2018-03-28 DIAGNOSIS — S82451D Displaced comminuted fracture of shaft of right fibula, subsequent encounter for closed fracture with routine healing: Secondary | ICD-10-CM | POA: Insufficient documentation

## 2018-03-28 DIAGNOSIS — E785 Hyperlipidemia, unspecified: Secondary | ICD-10-CM | POA: Diagnosis not present

## 2018-03-28 DIAGNOSIS — S069X9A Unspecified intracranial injury with loss of consciousness of unspecified duration, initial encounter: Secondary | ICD-10-CM | POA: Insufficient documentation

## 2018-03-28 DIAGNOSIS — N183 Chronic kidney disease, stage 3 unspecified: Secondary | ICD-10-CM

## 2018-03-28 DIAGNOSIS — S069XAA Unspecified intracranial injury with loss of consciousness status unknown, initial encounter: Secondary | ICD-10-CM | POA: Insufficient documentation

## 2018-03-28 LAB — CBC WITH DIFFERENTIAL/PLATELET
BASOS ABS: 0 10*3/uL (ref 0.0–0.1)
Basophils Relative: 0 %
Eosinophils Absolute: 0.4 10*3/uL (ref 0.0–0.7)
Eosinophils Relative: 4 %
HCT: 31.6 % — ABNORMAL LOW (ref 36.0–46.0)
HEMOGLOBIN: 10.2 g/dL — AB (ref 12.0–15.0)
LYMPHS PCT: 22 %
Lymphs Abs: 2.3 10*3/uL (ref 0.7–4.0)
MCH: 30.4 pg (ref 26.0–34.0)
MCHC: 32.3 g/dL (ref 30.0–36.0)
MCV: 94.3 fL (ref 78.0–100.0)
Monocytes Absolute: 1.6 10*3/uL — ABNORMAL HIGH (ref 0.1–1.0)
Monocytes Relative: 15 %
NEUTROS PCT: 59 %
Neutro Abs: 6.3 10*3/uL (ref 1.7–7.7)
Platelets: 241 10*3/uL (ref 150–400)
RBC: 3.35 MIL/uL — AB (ref 3.87–5.11)
RDW: 14.3 % (ref 11.5–15.5)
WBC: 10.6 10*3/uL — AB (ref 4.0–10.5)

## 2018-03-28 LAB — BASIC METABOLIC PANEL
ANION GAP: 8 (ref 5–15)
BUN: 25 mg/dL — AB (ref 8–23)
CHLORIDE: 105 mmol/L (ref 98–111)
CO2: 25 mmol/L (ref 22–32)
Calcium: 8.4 mg/dL — ABNORMAL LOW (ref 8.9–10.3)
Creatinine, Ser: 1.25 mg/dL — ABNORMAL HIGH (ref 0.44–1.00)
GFR calc Af Amer: 47 mL/min — ABNORMAL LOW (ref >60)
GFR calc non Af Amer: 40 mL/min — ABNORMAL LOW (ref >60)
Glucose, Bld: 99 mg/dL (ref 70–99)
POTASSIUM: 3.9 mmol/L (ref 3.5–5.1)
SODIUM: 138 mmol/L (ref 135–145)

## 2018-03-28 SURGERY — PHACOEMULSIFICATION, CATARACT, WITH IOL INSERTION
Anesthesia: Monitor Anesthesia Care | Site: Eye | Laterality: Left

## 2018-03-28 MED ORDER — PHENOBARBITAL 32.4 MG PO TABS: 32.4000 mg | ORAL_TABLET | Freq: Every day | ORAL | 0 refills | Status: AC
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
## 2018-03-28 NOTE — Telephone Encounter (Signed)
RX Fax for [facility]@ [phone]  

## 2018-04-04 NOTE — Telephone Encounter (Signed)
Patient's sister, nancy Paynter called this morning.  We have scheduled Ms. Denis for next Tuesday, July 6th at 2:00 with Dr. Hilda LiasKeeling.

## 2018-04-04 NOTE — Telephone Encounter (Signed)
I called back to [facility] to confirm appointment; spoke with [facility]; she is noting appointment and addressing it with transportation.

## 2018-04-05 ENCOUNTER — Encounter (HOSPITAL_COMMUNITY)
Admission: RE | Admit: 2018-04-05 | Discharge: 2018-04-05 | Disposition: A | Discharge status: Home / Self Care | Payer: Medicare Other | Source: Skilled Nursing Facility | Attending: Internal Medicine | Admitting: Internal Medicine

## 2018-04-05 DIAGNOSIS — Z5189 Encounter for other specified aftercare: Secondary | ICD-10-CM | POA: Insufficient documentation

## 2018-04-05 DIAGNOSIS — S82451D Displaced comminuted fracture of shaft of right fibula, subsequent encounter for closed fracture with routine healing: Secondary | ICD-10-CM | POA: Insufficient documentation

## 2018-04-05 DIAGNOSIS — Z9181 History of falling: Secondary | ICD-10-CM | POA: Insufficient documentation

## 2018-04-05 LAB — PHENOBARBITAL LEVEL: Phenobarbital: 8.4 ug/mL — ABNORMAL LOW (ref 15.0–30.0)

## 2018-04-05 LAB — ALBUMIN: ALBUMIN: 2.6 g/dL — AB (ref 3.5–5.0)

## 2018-04-12 ENCOUNTER — Encounter: Payer: Self-pay | Admitting: Orthopaedic Surgery

## 2018-04-12 ENCOUNTER — Inpatient Hospital Stay (INDEPENDENT_AMBULATORY_CARE_PROVIDER_SITE_OTHER): Payer: Medicare Other

## 2018-04-12 ENCOUNTER — Non-Acute Institutional Stay (SKILLED_NURSING_FACILITY): Payer: Medicare Other | Admitting: Internal Medicine

## 2018-04-12 ENCOUNTER — Ambulatory Visit (INDEPENDENT_AMBULATORY_CARE_PROVIDER_SITE_OTHER): Payer: Medicare Other | Admitting: Orthopaedic Surgery

## 2018-04-12 ENCOUNTER — Encounter: Payer: Self-pay | Admitting: Internal Medicine

## 2018-04-12 VITALS — BP 130/73 | HR 67 | Temp 97.0°F | Ht 63.0 in

## 2018-04-12 DIAGNOSIS — S82891A Other fracture of right lower leg, initial encounter for closed fracture: Secondary | ICD-10-CM

## 2018-04-12 DIAGNOSIS — N183 Chronic kidney disease, stage 3 unspecified: Secondary | ICD-10-CM

## 2018-04-12 DIAGNOSIS — E785 Hyperlipidemia, unspecified: Secondary | ICD-10-CM | POA: Diagnosis not present

## 2018-04-12 DIAGNOSIS — K219 Gastro-esophageal reflux disease without esophagitis: Secondary | ICD-10-CM | POA: Diagnosis not present

## 2018-04-12 DIAGNOSIS — S82891S Other fracture of right lower leg, sequela: Secondary | ICD-10-CM

## 2018-04-12 NOTE — Progress Notes (Signed)
CC:  My ankle is better  [facility] a [facility].  Dr. Romeo AppleHarrison had seen her several weeks ago for a fracture of the right ankle.  She has been wearing a CAM walker.  She has no new problem.  NV intact. Skin intact.  X-rays were done, reported separately.  Encounter Diagnosis  Name Primary?  Marland Kitchen. Ankle fracture, right, closed, initial encounter Yes   She has a healing lateral malleolus fracture on the right.  She may come out of the CAM walker periodically but needs to wear it most of the time and must have on when walking.  Return in three weeks.  X-rays of ankle on return.  Call if any problem.  Precautions discussed.   Electronically Signed Darreld McleanWayne Keyshla Tunison, MD 8/6/20192:46 PM
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
## 2018-04-13 ENCOUNTER — Other Ambulatory Visit (HOSPITAL_COMMUNITY)
Admission: RE | Admit: 2018-04-13 | Discharge: 2018-04-13 | Disposition: A | Discharge status: Home / Self Care | Payer: Medicare Other | Source: Skilled Nursing Facility | Attending: Internal Medicine | Admitting: Internal Medicine

## 2018-04-13 DIAGNOSIS — N183 Chronic kidney disease, stage 3 (moderate): Secondary | ICD-10-CM | POA: Insufficient documentation

## 2018-04-13 LAB — BASIC METABOLIC PANEL
ANION GAP: 5 (ref 5–15)
BUN: 28 mg/dL — ABNORMAL HIGH (ref 8–23)
CHLORIDE: 106 mmol/L (ref 98–111)
CO2: 28 mmol/L (ref 22–32)
CREATININE: 1.27 mg/dL — AB (ref 0.44–1.00)
Calcium: 8.5 mg/dL — ABNORMAL LOW (ref 8.9–10.3)
GFR calc Af Amer: 46 mL/min — ABNORMAL LOW (ref >60)
GFR calc non Af Amer: 40 mL/min — ABNORMAL LOW (ref >60)
Glucose, Bld: 83 mg/dL (ref 70–99)
Potassium: 4 mmol/L (ref 3.5–5.1)
SODIUM: 139 mmol/L (ref 135–145)

## 2018-04-13 LAB — CBC
HCT: 33.5 % — ABNORMAL LOW (ref 36.0–46.0)
HEMOGLOBIN: 10.8 g/dL — AB (ref 12.0–15.0)
MCH: 30.4 pg (ref 26.0–34.0)
MCHC: 32.2 g/dL (ref 30.0–36.0)
MCV: 94.4 fL (ref 78.0–100.0)
Platelets: 300 10*3/uL (ref 150–400)
RBC: 3.55 MIL/uL — AB (ref 3.87–5.11)
RDW: 13.6 % (ref 11.5–15.5)
WBC: 7.1 10*3/uL (ref 4.0–10.5)

## 2018-04-19 ENCOUNTER — Encounter (HOSPITAL_COMMUNITY)
Admission: RE | Admit: 2018-04-19 | Discharge: 2018-04-19 | Disposition: A | Discharge status: Home / Self Care | Payer: Medicare Other | Source: Skilled Nursing Facility | Attending: Internal Medicine | Admitting: Internal Medicine

## 2018-04-19 DIAGNOSIS — Z5189 Encounter for other specified aftercare: Secondary | ICD-10-CM | POA: Insufficient documentation

## 2018-04-19 DIAGNOSIS — S82451D Displaced comminuted fracture of shaft of right fibula, subsequent encounter for closed fracture with routine healing: Secondary | ICD-10-CM | POA: Insufficient documentation

## 2018-04-19 DIAGNOSIS — N183 Chronic kidney disease, stage 3 (moderate): Secondary | ICD-10-CM | POA: Insufficient documentation

## 2018-04-19 DIAGNOSIS — Z9181 History of falling: Secondary | ICD-10-CM | POA: Insufficient documentation

## 2018-04-21 DIAGNOSIS — D72829 Elevated white blood cell count, unspecified: Secondary | ICD-10-CM | POA: Diagnosis not present

## 2018-04-21 DIAGNOSIS — D509 Iron deficiency anemia, unspecified: Secondary | ICD-10-CM | POA: Diagnosis not present

## 2018-04-21 DIAGNOSIS — E782 Mixed hyperlipidemia: Secondary | ICD-10-CM | POA: Diagnosis not present

## 2018-04-21 DIAGNOSIS — E875 Hyperkalemia: Secondary | ICD-10-CM | POA: Diagnosis not present

## 2018-04-21 DIAGNOSIS — Z6825 Body mass index (BMI) 25.0-25.9, adult: Secondary | ICD-10-CM | POA: Diagnosis not present

## 2018-04-21 DIAGNOSIS — S8290XS Unspecified fracture of unspecified lower leg, sequela: Secondary | ICD-10-CM | POA: Diagnosis not present

## 2018-04-21 DIAGNOSIS — E876 Hypokalemia: Secondary | ICD-10-CM | POA: Diagnosis not present

## 2018-04-24 DIAGNOSIS — I69151 Hemiplegia and hemiparesis following nontraumatic intracerebral hemorrhage affecting right dominant side: Secondary | ICD-10-CM | POA: Diagnosis not present

## 2018-04-24 DIAGNOSIS — S82819D Torus fracture of upper end of unspecified fibula, subsequent encounter for fracture with routine healing: Secondary | ICD-10-CM | POA: Diagnosis not present

## 2018-04-24 DIAGNOSIS — N183 Chronic kidney disease, stage 3 (moderate): Secondary | ICD-10-CM | POA: Diagnosis not present

## 2018-04-24 DIAGNOSIS — Z7902 Long term (current) use of antithrombotics/antiplatelets: Secondary | ICD-10-CM | POA: Diagnosis not present

## 2018-05-02 DIAGNOSIS — I739 Peripheral vascular disease, unspecified: Secondary | ICD-10-CM | POA: Diagnosis not present

## 2018-05-04 ENCOUNTER — Encounter: Payer: Self-pay | Admitting: Orthopaedic Surgery

## 2018-05-04 ENCOUNTER — Ambulatory Visit (INDEPENDENT_AMBULATORY_CARE_PROVIDER_SITE_OTHER): Payer: Medicare Other

## 2018-05-04 ENCOUNTER — Ambulatory Visit (INDEPENDENT_AMBULATORY_CARE_PROVIDER_SITE_OTHER): Payer: Medicare Other | Admitting: Orthopaedic Surgery

## 2018-05-04 ENCOUNTER — Telehealth: Payer: Self-pay | Admitting: Orthopaedic Surgery

## 2018-05-04 VITALS — BP 141/76 | HR 73 | Ht 63.0 in

## 2018-05-04 DIAGNOSIS — S82891A Other fracture of right lower leg, initial encounter for closed fracture: Secondary | ICD-10-CM

## 2018-05-04 DIAGNOSIS — S82301D Unspecified fracture of lower end of right tibia, subsequent encounter for closed fracture with routine healing: Secondary | ICD-10-CM

## 2018-05-04 DIAGNOSIS — G8191 Hemiplegia, unspecified affecting right dominant side: Secondary | ICD-10-CM

## 2018-05-04 DIAGNOSIS — S82831D Other fracture of upper and lower end of right fibula, subsequent encounter for closed fracture with routine healing: Secondary | ICD-10-CM

## 2018-05-04 NOTE — Progress Notes (Signed)
CC:  I have no pain  She has done well with the distal fibula right.  She has right sided hemiparesis and has been using a CAM walker and wheelchair.  She is resident at local rest home.  She has no pain of the right leg.  She has right hemiparesis.    X-rays were done, reported separately.  Encounter Diagnoses  Name Primary?  . Fracture of distal end of tibia with fibula, right, closed, with routine healing, subsequent encounter   . Right hemiparesis (HCC)   . Ankle fracture, right, closed, initial encounter Yes   I will see as needed.  She can come out of the CAM walker.  Forms for rest home completed.  Call if any problem.  Precautions discussed.   Electronically Signed Darreld McleanWayne Guneet Delpino, MD 8/28/20193:18 PM

## 2018-05-04 NOTE — Telephone Encounter (Signed)
Patient's sister and designated contact, Physiological scientistancy Payner, stopped back to office following patient's visit to inquire about patient's home therapy, [facility].  Harriett Sineancy states that physical therapist will not continue therapy without a brace being on the right leg for support.  Please advise.

## 2018-05-05 NOTE — Telephone Encounter (Signed)
The fracture is healed,no need for a brace.  She is post stroke and has weakness on the right side.  She needs PT to help regain what strength she had and lost while immobilized.

## 2018-05-05 NOTE — Telephone Encounter (Signed)
Notified patient/designated party [facility] and [facility].

## 2018-05-16 DIAGNOSIS — E878 Other disorders of electrolyte and fluid balance, not elsewhere classified: Secondary | ICD-10-CM | POA: Diagnosis not present

## 2018-05-16 DIAGNOSIS — F2089 Other schizophrenia: Secondary | ICD-10-CM | POA: Diagnosis not present

## 2018-05-16 DIAGNOSIS — N182 Chronic kidney disease, stage 2 (mild): Secondary | ICD-10-CM | POA: Diagnosis not present

## 2018-05-16 DIAGNOSIS — E875 Hyperkalemia: Secondary | ICD-10-CM | POA: Diagnosis not present

## 2018-05-20 DIAGNOSIS — F2089 Other schizophrenia: Secondary | ICD-10-CM | POA: Diagnosis not present

## 2018-05-20 DIAGNOSIS — E878 Other disorders of electrolyte and fluid balance, not elsewhere classified: Secondary | ICD-10-CM | POA: Diagnosis not present

## 2018-05-20 DIAGNOSIS — N182 Chronic kidney disease, stage 2 (mild): Secondary | ICD-10-CM | POA: Diagnosis not present

## 2018-05-20 DIAGNOSIS — I10 Essential (primary) hypertension: Secondary | ICD-10-CM | POA: Diagnosis not present

## 2018-06-23 DIAGNOSIS — F2089 Other schizophrenia: Secondary | ICD-10-CM | POA: Diagnosis not present

## 2018-06-23 DIAGNOSIS — Z23 Encounter for immunization: Secondary | ICD-10-CM | POA: Diagnosis not present

## 2018-06-23 DIAGNOSIS — I10 Essential (primary) hypertension: Secondary | ICD-10-CM | POA: Diagnosis not present

## 2018-06-23 DIAGNOSIS — G479 Sleep disorder, unspecified: Secondary | ICD-10-CM | POA: Diagnosis not present

## 2018-07-18 DIAGNOSIS — I739 Peripheral vascular disease, unspecified: Secondary | ICD-10-CM | POA: Diagnosis not present

## 2018-08-23 DIAGNOSIS — R7301 Impaired fasting glucose: Secondary | ICD-10-CM | POA: Diagnosis not present

## 2018-08-23 DIAGNOSIS — N182 Chronic kidney disease, stage 2 (mild): Secondary | ICD-10-CM | POA: Diagnosis not present

## 2018-08-23 DIAGNOSIS — I10 Essential (primary) hypertension: Secondary | ICD-10-CM | POA: Diagnosis not present

## 2018-08-23 DIAGNOSIS — E875 Hyperkalemia: Secondary | ICD-10-CM | POA: Diagnosis not present

## 2018-10-03 DIAGNOSIS — I739 Peripheral vascular disease, unspecified: Secondary | ICD-10-CM | POA: Diagnosis not present

## 2018-12-09 DIAGNOSIS — F209 Schizophrenia, unspecified: Secondary | ICD-10-CM | POA: Diagnosis not present

## 2018-12-09 DIAGNOSIS — E782 Mixed hyperlipidemia: Secondary | ICD-10-CM | POA: Diagnosis not present

## 2018-12-09 DIAGNOSIS — I10 Essential (primary) hypertension: Secondary | ICD-10-CM | POA: Diagnosis not present

## 2018-12-09 DIAGNOSIS — N182 Chronic kidney disease, stage 2 (mild): Secondary | ICD-10-CM | POA: Diagnosis not present

## 2019-01-01 ENCOUNTER — Encounter: Payer: Self-pay | Admitting: Orthopaedic Surgery

## 2019-01-01 ENCOUNTER — Encounter: Payer: Self-pay | Admitting: Orthopedic Surgery

## 2019-01-16 DIAGNOSIS — I739 Peripheral vascular disease, unspecified: Secondary | ICD-10-CM | POA: Diagnosis not present

## 2019-01-19 DIAGNOSIS — Z0000 Encounter for general adult medical examination without abnormal findings: Secondary | ICD-10-CM | POA: Diagnosis not present

## 2019-01-31 DIAGNOSIS — F411 Generalized anxiety disorder: Secondary | ICD-10-CM | POA: Diagnosis not present

## 2019-01-31 DIAGNOSIS — E875 Hyperkalemia: Secondary | ICD-10-CM | POA: Diagnosis not present

## 2019-01-31 DIAGNOSIS — R7301 Impaired fasting glucose: Secondary | ICD-10-CM | POA: Diagnosis not present

## 2019-01-31 DIAGNOSIS — I10 Essential (primary) hypertension: Secondary | ICD-10-CM | POA: Diagnosis not present

## 2019-02-03 DIAGNOSIS — N182 Chronic kidney disease, stage 2 (mild): Secondary | ICD-10-CM | POA: Diagnosis not present

## 2019-02-03 DIAGNOSIS — I10 Essential (primary) hypertension: Secondary | ICD-10-CM | POA: Diagnosis not present

## 2019-02-03 DIAGNOSIS — E782 Mixed hyperlipidemia: Secondary | ICD-10-CM | POA: Diagnosis not present

## 2019-02-03 DIAGNOSIS — K219 Gastro-esophageal reflux disease without esophagitis: Secondary | ICD-10-CM | POA: Diagnosis not present

## 2019-04-03 DIAGNOSIS — I739 Peripheral vascular disease, unspecified: Secondary | ICD-10-CM | POA: Diagnosis not present

## 2019-04-13 DIAGNOSIS — I10 Essential (primary) hypertension: Secondary | ICD-10-CM | POA: Diagnosis not present

## 2019-04-13 DIAGNOSIS — E782 Mixed hyperlipidemia: Secondary | ICD-10-CM | POA: Diagnosis not present

## 2019-04-13 DIAGNOSIS — N182 Chronic kidney disease, stage 2 (mild): Secondary | ICD-10-CM | POA: Diagnosis not present

## 2019-04-13 DIAGNOSIS — K219 Gastro-esophageal reflux disease without esophagitis: Secondary | ICD-10-CM | POA: Diagnosis not present

## 2019-05-09 DIAGNOSIS — E782 Mixed hyperlipidemia: Secondary | ICD-10-CM | POA: Diagnosis not present

## 2019-05-09 DIAGNOSIS — R7301 Impaired fasting glucose: Secondary | ICD-10-CM | POA: Diagnosis not present

## 2019-05-09 DIAGNOSIS — I10 Essential (primary) hypertension: Secondary | ICD-10-CM | POA: Diagnosis not present

## 2019-05-09 DIAGNOSIS — D509 Iron deficiency anemia, unspecified: Secondary | ICD-10-CM | POA: Diagnosis not present

## 2019-05-16 DIAGNOSIS — I10 Essential (primary) hypertension: Secondary | ICD-10-CM | POA: Diagnosis not present

## 2019-05-16 DIAGNOSIS — Z23 Encounter for immunization: Secondary | ICD-10-CM | POA: Diagnosis not present

## 2019-05-16 DIAGNOSIS — N182 Chronic kidney disease, stage 2 (mild): Secondary | ICD-10-CM | POA: Diagnosis not present

## 2019-05-16 DIAGNOSIS — E782 Mixed hyperlipidemia: Secondary | ICD-10-CM | POA: Diagnosis not present

## 2019-06-19 DIAGNOSIS — I739 Peripheral vascular disease, unspecified: Secondary | ICD-10-CM | POA: Diagnosis not present

## 2019-07-31 DIAGNOSIS — Z8673 Personal history of transient ischemic attack (TIA), and cerebral infarction without residual deficits: Secondary | ICD-10-CM | POA: Diagnosis not present

## 2019-07-31 DIAGNOSIS — Z79899 Other long term (current) drug therapy: Secondary | ICD-10-CM | POA: Diagnosis not present

## 2019-07-31 DIAGNOSIS — Z8781 Personal history of (healed) traumatic fracture: Secondary | ICD-10-CM | POA: Diagnosis not present

## 2019-07-31 DIAGNOSIS — Z87820 Personal history of traumatic brain injury: Secondary | ICD-10-CM | POA: Diagnosis not present

## 2019-07-31 DIAGNOSIS — R2689 Other abnormalities of gait and mobility: Secondary | ICD-10-CM | POA: Diagnosis not present

## 2019-07-31 DIAGNOSIS — Z7902 Long term (current) use of antithrombotics/antiplatelets: Secondary | ICD-10-CM | POA: Diagnosis not present

## 2019-07-31 DIAGNOSIS — G8191 Hemiplegia, unspecified affecting right dominant side: Secondary | ICD-10-CM | POA: Diagnosis not present

## 2019-08-28 DIAGNOSIS — I739 Peripheral vascular disease, unspecified: Secondary | ICD-10-CM | POA: Diagnosis not present

## 2019-08-31 DIAGNOSIS — Z87820 Personal history of traumatic brain injury: Secondary | ICD-10-CM | POA: Diagnosis not present

## 2019-08-31 DIAGNOSIS — R2689 Other abnormalities of gait and mobility: Secondary | ICD-10-CM | POA: Diagnosis not present

## 2019-08-31 DIAGNOSIS — Z8781 Personal history of (healed) traumatic fracture: Secondary | ICD-10-CM | POA: Diagnosis not present

## 2019-08-31 DIAGNOSIS — G8191 Hemiplegia, unspecified affecting right dominant side: Secondary | ICD-10-CM | POA: Diagnosis not present

## 2019-08-31 DIAGNOSIS — Z7902 Long term (current) use of antithrombotics/antiplatelets: Secondary | ICD-10-CM | POA: Diagnosis not present

## 2019-08-31 DIAGNOSIS — Z79899 Other long term (current) drug therapy: Secondary | ICD-10-CM | POA: Diagnosis not present

## 2019-08-31 DIAGNOSIS — Z8673 Personal history of transient ischemic attack (TIA), and cerebral infarction without residual deficits: Secondary | ICD-10-CM | POA: Diagnosis not present

## 2019-09-06 DIAGNOSIS — W19XXXA Unspecified fall, initial encounter: Secondary | ICD-10-CM | POA: Diagnosis not present

## 2019-09-06 DIAGNOSIS — M79661 Pain in right lower leg: Secondary | ICD-10-CM | POA: Diagnosis not present

## 2019-09-06 DIAGNOSIS — M79662 Pain in left lower leg: Secondary | ICD-10-CM | POA: Diagnosis not present

## 2019-09-10 DIAGNOSIS — M79671 Pain in right foot: Secondary | ICD-10-CM | POA: Diagnosis not present

## 2019-09-10 DIAGNOSIS — M25561 Pain in right knee: Secondary | ICD-10-CM | POA: Diagnosis not present

## 2019-09-10 DIAGNOSIS — M25572 Pain in left ankle and joints of left foot: Secondary | ICD-10-CM | POA: Diagnosis not present

## 2019-09-10 DIAGNOSIS — M25562 Pain in left knee: Secondary | ICD-10-CM | POA: Diagnosis not present

## 2019-09-10 DIAGNOSIS — M25571 Pain in right ankle and joints of right foot: Secondary | ICD-10-CM | POA: Diagnosis not present

## 2019-10-17 DIAGNOSIS — E7849 Other hyperlipidemia: Secondary | ICD-10-CM | POA: Diagnosis not present

## 2019-10-17 DIAGNOSIS — I129 Hypertensive chronic kidney disease with stage 1 through stage 4 chronic kidney disease, or unspecified chronic kidney disease: Secondary | ICD-10-CM | POA: Diagnosis not present

## 2019-10-17 DIAGNOSIS — N182 Chronic kidney disease, stage 2 (mild): Secondary | ICD-10-CM | POA: Diagnosis not present

## 2019-10-17 DIAGNOSIS — I10 Essential (primary) hypertension: Secondary | ICD-10-CM | POA: Diagnosis not present

## 2019-11-13 DIAGNOSIS — I739 Peripheral vascular disease, unspecified: Secondary | ICD-10-CM | POA: Diagnosis not present

## 2019-11-15 DIAGNOSIS — E875 Hyperkalemia: Secondary | ICD-10-CM | POA: Diagnosis not present

## 2019-11-15 DIAGNOSIS — E878 Other disorders of electrolyte and fluid balance, not elsewhere classified: Secondary | ICD-10-CM | POA: Diagnosis not present

## 2019-11-15 DIAGNOSIS — F2089 Other schizophrenia: Secondary | ICD-10-CM | POA: Diagnosis not present

## 2019-11-15 DIAGNOSIS — N182 Chronic kidney disease, stage 2 (mild): Secondary | ICD-10-CM | POA: Diagnosis not present

## 2019-11-21 DIAGNOSIS — I10 Essential (primary) hypertension: Secondary | ICD-10-CM | POA: Diagnosis not present

## 2019-11-21 DIAGNOSIS — R7301 Impaired fasting glucose: Secondary | ICD-10-CM | POA: Diagnosis not present

## 2019-11-21 DIAGNOSIS — E782 Mixed hyperlipidemia: Secondary | ICD-10-CM | POA: Diagnosis not present

## 2019-11-21 DIAGNOSIS — N182 Chronic kidney disease, stage 2 (mild): Secondary | ICD-10-CM | POA: Diagnosis not present

## 2020-01-29 DIAGNOSIS — I739 Peripheral vascular disease, unspecified: Secondary | ICD-10-CM | POA: Diagnosis not present

## 2020-04-15 DIAGNOSIS — I739 Peripheral vascular disease, unspecified: Secondary | ICD-10-CM | POA: Diagnosis not present

## 2020-05-23 DIAGNOSIS — E875 Hyperkalemia: Secondary | ICD-10-CM | POA: Diagnosis not present

## 2020-05-23 DIAGNOSIS — E878 Other disorders of electrolyte and fluid balance, not elsewhere classified: Secondary | ICD-10-CM | POA: Diagnosis not present

## 2020-05-23 DIAGNOSIS — F2089 Other schizophrenia: Secondary | ICD-10-CM | POA: Diagnosis not present

## 2020-05-23 DIAGNOSIS — N182 Chronic kidney disease, stage 2 (mild): Secondary | ICD-10-CM | POA: Diagnosis not present

## 2020-05-28 DIAGNOSIS — K219 Gastro-esophageal reflux disease without esophagitis: Secondary | ICD-10-CM | POA: Diagnosis not present

## 2020-05-28 DIAGNOSIS — E782 Mixed hyperlipidemia: Secondary | ICD-10-CM | POA: Diagnosis not present

## 2020-05-28 DIAGNOSIS — I10 Essential (primary) hypertension: Secondary | ICD-10-CM | POA: Diagnosis not present

## 2020-05-28 DIAGNOSIS — F2089 Other schizophrenia: Secondary | ICD-10-CM | POA: Diagnosis not present

## 2020-07-08 DIAGNOSIS — I739 Peripheral vascular disease, unspecified: Secondary | ICD-10-CM | POA: Diagnosis not present

## 2020-07-10 DIAGNOSIS — K219 Gastro-esophageal reflux disease without esophagitis: Secondary | ICD-10-CM | POA: Diagnosis not present

## 2020-07-10 DIAGNOSIS — N182 Chronic kidney disease, stage 2 (mild): Secondary | ICD-10-CM | POA: Diagnosis not present

## 2020-07-10 DIAGNOSIS — I129 Hypertensive chronic kidney disease with stage 1 through stage 4 chronic kidney disease, or unspecified chronic kidney disease: Secondary | ICD-10-CM | POA: Diagnosis not present

## 2020-07-10 DIAGNOSIS — E782 Mixed hyperlipidemia: Secondary | ICD-10-CM | POA: Diagnosis not present

## 2020-10-05 DIAGNOSIS — N182 Chronic kidney disease, stage 2 (mild): Secondary | ICD-10-CM | POA: Diagnosis not present

## 2020-10-05 DIAGNOSIS — I129 Hypertensive chronic kidney disease with stage 1 through stage 4 chronic kidney disease, or unspecified chronic kidney disease: Secondary | ICD-10-CM | POA: Diagnosis not present

## 2020-10-05 DIAGNOSIS — K219 Gastro-esophageal reflux disease without esophagitis: Secondary | ICD-10-CM | POA: Diagnosis not present

## 2020-10-05 DIAGNOSIS — I10 Essential (primary) hypertension: Secondary | ICD-10-CM | POA: Diagnosis not present

## 2020-10-07 DIAGNOSIS — I739 Peripheral vascular disease, unspecified: Secondary | ICD-10-CM | POA: Diagnosis not present

## 2020-11-12 DIAGNOSIS — E878 Other disorders of electrolyte and fluid balance, not elsewhere classified: Secondary | ICD-10-CM | POA: Diagnosis not present

## 2020-11-12 DIAGNOSIS — N182 Chronic kidney disease, stage 2 (mild): Secondary | ICD-10-CM | POA: Diagnosis not present

## 2020-11-12 DIAGNOSIS — E875 Hyperkalemia: Secondary | ICD-10-CM | POA: Diagnosis not present

## 2020-11-12 DIAGNOSIS — I10 Essential (primary) hypertension: Secondary | ICD-10-CM | POA: Diagnosis not present

## 2020-11-19 DIAGNOSIS — K219 Gastro-esophageal reflux disease without esophagitis: Secondary | ICD-10-CM | POA: Diagnosis not present

## 2020-11-19 DIAGNOSIS — I129 Hypertensive chronic kidney disease with stage 1 through stage 4 chronic kidney disease, or unspecified chronic kidney disease: Secondary | ICD-10-CM | POA: Diagnosis not present

## 2020-11-19 DIAGNOSIS — I10 Essential (primary) hypertension: Secondary | ICD-10-CM | POA: Diagnosis not present

## 2020-11-19 DIAGNOSIS — E782 Mixed hyperlipidemia: Secondary | ICD-10-CM | POA: Diagnosis not present

## 2020-12-04 DIAGNOSIS — F2089 Other schizophrenia: Secondary | ICD-10-CM | POA: Diagnosis not present

## 2020-12-04 DIAGNOSIS — E782 Mixed hyperlipidemia: Secondary | ICD-10-CM | POA: Diagnosis not present

## 2020-12-04 DIAGNOSIS — I10 Essential (primary) hypertension: Secondary | ICD-10-CM | POA: Diagnosis not present

## 2020-12-04 DIAGNOSIS — K219 Gastro-esophageal reflux disease without esophagitis: Secondary | ICD-10-CM | POA: Diagnosis not present

## 2020-12-23 DIAGNOSIS — I739 Peripheral vascular disease, unspecified: Secondary | ICD-10-CM | POA: Diagnosis not present

## 2021-01-05 DIAGNOSIS — M1990 Unspecified osteoarthritis, unspecified site: Secondary | ICD-10-CM | POA: Diagnosis not present

## 2021-01-05 DIAGNOSIS — I10 Essential (primary) hypertension: Secondary | ICD-10-CM | POA: Diagnosis not present

## 2021-03-03 DIAGNOSIS — I739 Peripheral vascular disease, unspecified: Secondary | ICD-10-CM | POA: Diagnosis not present

## 2021-03-27 DIAGNOSIS — U071 COVID-19: Secondary | ICD-10-CM | POA: Diagnosis not present

## 2021-05-26 DIAGNOSIS — N184 Chronic kidney disease, stage 4 (severe): Secondary | ICD-10-CM | POA: Diagnosis not present

## 2021-05-26 DIAGNOSIS — E782 Mixed hyperlipidemia: Secondary | ICD-10-CM | POA: Diagnosis not present

## 2021-05-30 DIAGNOSIS — E785 Hyperlipidemia, unspecified: Secondary | ICD-10-CM | POA: Diagnosis not present

## 2021-05-30 DIAGNOSIS — N189 Chronic kidney disease, unspecified: Secondary | ICD-10-CM | POA: Diagnosis not present

## 2021-06-23 DIAGNOSIS — I739 Peripheral vascular disease, unspecified: Secondary | ICD-10-CM | POA: Diagnosis not present

## 2021-06-24 DIAGNOSIS — Z23 Encounter for immunization: Secondary | ICD-10-CM | POA: Diagnosis not present

## 2021-08-06 DIAGNOSIS — I10 Essential (primary) hypertension: Secondary | ICD-10-CM | POA: Diagnosis not present

## 2021-08-06 DIAGNOSIS — E782 Mixed hyperlipidemia: Secondary | ICD-10-CM | POA: Diagnosis not present

## 2021-09-15 DIAGNOSIS — I739 Peripheral vascular disease, unspecified: Secondary | ICD-10-CM | POA: Diagnosis not present

## 2021-10-07 DIAGNOSIS — I10 Essential (primary) hypertension: Secondary | ICD-10-CM | POA: Diagnosis not present

## 2021-10-07 DIAGNOSIS — E782 Mixed hyperlipidemia: Secondary | ICD-10-CM | POA: Diagnosis not present

## 2021-11-18 DIAGNOSIS — R7301 Impaired fasting glucose: Secondary | ICD-10-CM | POA: Diagnosis not present

## 2021-11-18 DIAGNOSIS — D509 Iron deficiency anemia, unspecified: Secondary | ICD-10-CM | POA: Diagnosis not present

## 2021-11-18 DIAGNOSIS — I10 Essential (primary) hypertension: Secondary | ICD-10-CM | POA: Diagnosis not present

## 2021-11-24 DIAGNOSIS — I739 Peripheral vascular disease, unspecified: Secondary | ICD-10-CM | POA: Diagnosis not present

## 2021-11-25 DIAGNOSIS — E782 Mixed hyperlipidemia: Secondary | ICD-10-CM | POA: Diagnosis not present

## 2021-11-25 DIAGNOSIS — N1831 Chronic kidney disease, stage 3a: Secondary | ICD-10-CM | POA: Diagnosis not present

## 2021-11-25 DIAGNOSIS — G40909 Epilepsy, unspecified, not intractable, without status epilepticus: Secondary | ICD-10-CM | POA: Diagnosis not present

## 2021-11-25 DIAGNOSIS — D509 Iron deficiency anemia, unspecified: Secondary | ICD-10-CM | POA: Diagnosis not present

## 2022-01-14 DIAGNOSIS — F5104 Psychophysiologic insomnia: Secondary | ICD-10-CM | POA: Diagnosis not present

## 2022-02-03 DIAGNOSIS — I739 Peripheral vascular disease, unspecified: Secondary | ICD-10-CM | POA: Diagnosis not present

## 2022-04-21 DIAGNOSIS — I739 Peripheral vascular disease, unspecified: Secondary | ICD-10-CM | POA: Diagnosis not present

## 2022-05-21 DIAGNOSIS — R55 Syncope and collapse: Secondary | ICD-10-CM | POA: Diagnosis not present

## 2022-05-21 DIAGNOSIS — N1831 Chronic kidney disease, stage 3a: Secondary | ICD-10-CM | POA: Diagnosis not present

## 2022-05-21 DIAGNOSIS — G40909 Epilepsy, unspecified, not intractable, without status epilepticus: Secondary | ICD-10-CM | POA: Diagnosis not present

## 2022-05-21 DIAGNOSIS — F5104 Psychophysiologic insomnia: Secondary | ICD-10-CM | POA: Diagnosis not present

## 2022-05-21 DIAGNOSIS — E782 Mixed hyperlipidemia: Secondary | ICD-10-CM | POA: Diagnosis not present

## 2022-05-21 DIAGNOSIS — R7301 Impaired fasting glucose: Secondary | ICD-10-CM | POA: Diagnosis not present

## 2022-05-28 DIAGNOSIS — N1832 Chronic kidney disease, stage 3b: Secondary | ICD-10-CM | POA: Diagnosis not present

## 2022-05-28 DIAGNOSIS — D509 Iron deficiency anemia, unspecified: Secondary | ICD-10-CM | POA: Diagnosis not present

## 2022-05-28 DIAGNOSIS — E782 Mixed hyperlipidemia: Secondary | ICD-10-CM | POA: Diagnosis not present

## 2022-05-28 DIAGNOSIS — R600 Localized edema: Secondary | ICD-10-CM | POA: Diagnosis not present

## 2022-07-07 DIAGNOSIS — I739 Peripheral vascular disease, unspecified: Secondary | ICD-10-CM | POA: Diagnosis not present

## 2022-09-01 DIAGNOSIS — J069 Acute upper respiratory infection, unspecified: Secondary | ICD-10-CM | POA: Diagnosis not present

## 2022-09-08 DIAGNOSIS — R197 Diarrhea, unspecified: Secondary | ICD-10-CM | POA: Diagnosis not present

## 2022-09-08 DIAGNOSIS — J069 Acute upper respiratory infection, unspecified: Secondary | ICD-10-CM | POA: Diagnosis not present

## 2022-09-22 DIAGNOSIS — E782 Mixed hyperlipidemia: Secondary | ICD-10-CM | POA: Diagnosis not present

## 2022-09-22 DIAGNOSIS — G40909 Epilepsy, unspecified, not intractable, without status epilepticus: Secondary | ICD-10-CM | POA: Diagnosis not present

## 2022-09-22 DIAGNOSIS — R7301 Impaired fasting glucose: Secondary | ICD-10-CM | POA: Diagnosis not present

## 2022-09-28 DIAGNOSIS — R7303 Prediabetes: Secondary | ICD-10-CM | POA: Diagnosis not present

## 2022-09-28 DIAGNOSIS — E782 Mixed hyperlipidemia: Secondary | ICD-10-CM | POA: Diagnosis not present

## 2022-09-28 DIAGNOSIS — E785 Hyperlipidemia, unspecified: Secondary | ICD-10-CM | POA: Diagnosis not present

## 2022-09-28 DIAGNOSIS — D509 Iron deficiency anemia, unspecified: Secondary | ICD-10-CM | POA: Diagnosis not present

## 2022-09-28 DIAGNOSIS — R600 Localized edema: Secondary | ICD-10-CM | POA: Diagnosis not present

## 2022-09-28 DIAGNOSIS — Z0001 Encounter for general adult medical examination with abnormal findings: Secondary | ICD-10-CM | POA: Diagnosis not present

## 2022-09-28 DIAGNOSIS — I129 Hypertensive chronic kidney disease with stage 1 through stage 4 chronic kidney disease, or unspecified chronic kidney disease: Secondary | ICD-10-CM | POA: Diagnosis not present

## 2022-09-28 DIAGNOSIS — N1832 Chronic kidney disease, stage 3b: Secondary | ICD-10-CM | POA: Diagnosis not present

## 2022-09-29 DIAGNOSIS — I739 Peripheral vascular disease, unspecified: Secondary | ICD-10-CM | POA: Diagnosis not present

## 2022-12-08 DIAGNOSIS — I739 Peripheral vascular disease, unspecified: Secondary | ICD-10-CM | POA: Diagnosis not present

## 2022-12-30 DIAGNOSIS — R7303 Prediabetes: Secondary | ICD-10-CM | POA: Diagnosis not present

## 2022-12-30 DIAGNOSIS — E782 Mixed hyperlipidemia: Secondary | ICD-10-CM | POA: Diagnosis not present

## 2023-01-05 DIAGNOSIS — E782 Mixed hyperlipidemia: Secondary | ICD-10-CM | POA: Diagnosis not present

## 2023-01-05 DIAGNOSIS — E785 Hyperlipidemia, unspecified: Secondary | ICD-10-CM | POA: Diagnosis not present

## 2023-01-05 DIAGNOSIS — I129 Hypertensive chronic kidney disease with stage 1 through stage 4 chronic kidney disease, or unspecified chronic kidney disease: Secondary | ICD-10-CM | POA: Diagnosis not present

## 2023-01-05 DIAGNOSIS — R600 Localized edema: Secondary | ICD-10-CM | POA: Diagnosis not present

## 2023-01-05 DIAGNOSIS — N1832 Chronic kidney disease, stage 3b: Secondary | ICD-10-CM | POA: Diagnosis not present

## 2023-01-05 DIAGNOSIS — D509 Iron deficiency anemia, unspecified: Secondary | ICD-10-CM | POA: Diagnosis not present

## 2023-02-04 DIAGNOSIS — L89302 Pressure ulcer of unspecified buttock, stage 2: Secondary | ICD-10-CM | POA: Diagnosis not present

## 2023-02-04 DIAGNOSIS — L0231 Cutaneous abscess of buttock: Secondary | ICD-10-CM | POA: Diagnosis not present

## 2023-02-04 DIAGNOSIS — T148XXA Other injury of unspecified body region, initial encounter: Secondary | ICD-10-CM | POA: Diagnosis not present

## 2023-02-04 DIAGNOSIS — M7989 Other specified soft tissue disorders: Secondary | ICD-10-CM | POA: Diagnosis not present

## 2023-02-04 DIAGNOSIS — L0291 Cutaneous abscess, unspecified: Secondary | ICD-10-CM | POA: Diagnosis not present

## 2023-02-16 DIAGNOSIS — I739 Peripheral vascular disease, unspecified: Secondary | ICD-10-CM | POA: Diagnosis not present

## 2023-04-12 DIAGNOSIS — G40909 Epilepsy, unspecified, not intractable, without status epilepticus: Secondary | ICD-10-CM | POA: Diagnosis not present

## 2023-04-12 DIAGNOSIS — R7303 Prediabetes: Secondary | ICD-10-CM | POA: Diagnosis not present

## 2023-04-12 DIAGNOSIS — E782 Mixed hyperlipidemia: Secondary | ICD-10-CM | POA: Diagnosis not present

## 2023-04-16 DIAGNOSIS — I129 Hypertensive chronic kidney disease with stage 1 through stage 4 chronic kidney disease, or unspecified chronic kidney disease: Secondary | ICD-10-CM | POA: Diagnosis not present

## 2023-04-16 DIAGNOSIS — D509 Iron deficiency anemia, unspecified: Secondary | ICD-10-CM | POA: Diagnosis not present

## 2023-04-16 DIAGNOSIS — N1832 Chronic kidney disease, stage 3b: Secondary | ICD-10-CM | POA: Diagnosis not present

## 2023-04-16 DIAGNOSIS — E782 Mixed hyperlipidemia: Secondary | ICD-10-CM | POA: Diagnosis not present

## 2023-04-16 DIAGNOSIS — R600 Localized edema: Secondary | ICD-10-CM | POA: Diagnosis not present

## 2023-04-27 DIAGNOSIS — I739 Peripheral vascular disease, unspecified: Secondary | ICD-10-CM | POA: Diagnosis not present

## 2023-07-05 DIAGNOSIS — I739 Peripheral vascular disease, unspecified: Secondary | ICD-10-CM | POA: Diagnosis not present

## 2023-08-03 DIAGNOSIS — G40909 Epilepsy, unspecified, not intractable, without status epilepticus: Secondary | ICD-10-CM | POA: Diagnosis not present

## 2023-08-03 DIAGNOSIS — R7303 Prediabetes: Secondary | ICD-10-CM | POA: Diagnosis not present

## 2023-08-03 DIAGNOSIS — E782 Mixed hyperlipidemia: Secondary | ICD-10-CM | POA: Diagnosis not present

## 2023-08-13 DIAGNOSIS — N1832 Chronic kidney disease, stage 3b: Secondary | ICD-10-CM | POA: Diagnosis not present

## 2023-08-13 DIAGNOSIS — Z23 Encounter for immunization: Secondary | ICD-10-CM | POA: Diagnosis not present

## 2023-08-13 DIAGNOSIS — D72829 Elevated white blood cell count, unspecified: Secondary | ICD-10-CM | POA: Diagnosis not present

## 2023-08-13 DIAGNOSIS — I129 Hypertensive chronic kidney disease with stage 1 through stage 4 chronic kidney disease, or unspecified chronic kidney disease: Secondary | ICD-10-CM | POA: Diagnosis not present

## 2023-08-13 DIAGNOSIS — D509 Iron deficiency anemia, unspecified: Secondary | ICD-10-CM | POA: Diagnosis not present

## 2023-08-13 DIAGNOSIS — E782 Mixed hyperlipidemia: Secondary | ICD-10-CM | POA: Diagnosis not present

## 2023-09-10 DIAGNOSIS — I739 Peripheral vascular disease, unspecified: Secondary | ICD-10-CM | POA: Diagnosis not present

## 2023-10-29 DIAGNOSIS — F2089 Other schizophrenia: Secondary | ICD-10-CM | POA: Diagnosis not present

## 2023-10-29 DIAGNOSIS — G40909 Epilepsy, unspecified, not intractable, without status epilepticus: Secondary | ICD-10-CM | POA: Diagnosis not present

## 2023-10-29 DIAGNOSIS — G8194 Hemiplegia, unspecified affecting left nondominant side: Secondary | ICD-10-CM | POA: Diagnosis not present

## 2023-10-29 DIAGNOSIS — F5104 Psychophysiologic insomnia: Secondary | ICD-10-CM | POA: Diagnosis not present

## 2023-10-29 DIAGNOSIS — R531 Weakness: Secondary | ICD-10-CM | POA: Diagnosis not present

## 2023-10-29 DIAGNOSIS — Z87820 Personal history of traumatic brain injury: Secondary | ICD-10-CM | POA: Diagnosis not present

## 2023-11-06 DEATH — deceased
